# Patient Record
Sex: Female | Born: 1975 | Race: Black or African American | Hispanic: No | Marital: Single | Smoking: Never smoker
Health system: Southern US, Community
[De-identification: ages and names within clinical notes are randomized; demographics above are authoritative.]

## PROBLEM LIST (undated history)

## (undated) DIAGNOSIS — I10 Essential (primary) hypertension: Secondary | ICD-10-CM

## (undated) HISTORY — DX: Essential (primary) hypertension: I10

## (undated) HISTORY — PX: ABDOMINAL HYSTERECTOMY: SHX81

## (undated) HISTORY — PX: BREAST BIOPSY: SHX20

---

## 2000-06-15 ENCOUNTER — Emergency Department (HOSPITAL_COMMUNITY): Admission: EM | Admit: 2000-06-15 | Discharge: 2000-06-15 | Payer: Self-pay | Admitting: Emergency Medicine

## 2001-11-12 ENCOUNTER — Other Ambulatory Visit: Admission: RE | Admit: 2001-11-12 | Discharge: 2001-11-12 | Payer: Self-pay | Admitting: Obstetrics and Gynecology

## 2002-03-31 ENCOUNTER — Other Ambulatory Visit: Admission: RE | Admit: 2002-03-31 | Discharge: 2002-03-31 | Payer: Self-pay | Admitting: Obstetrics and Gynecology

## 2002-07-03 ENCOUNTER — Other Ambulatory Visit: Admission: RE | Admit: 2002-07-03 | Discharge: 2002-07-03 | Payer: Self-pay | Admitting: Obstetrics and Gynecology

## 2003-05-22 ENCOUNTER — Inpatient Hospital Stay (HOSPITAL_COMMUNITY): Admission: AD | Admit: 2003-05-22 | Discharge: 2003-05-22 | Payer: Self-pay | Admitting: *Deleted

## 2003-05-27 ENCOUNTER — Encounter: Admission: RE | Admit: 2003-05-27 | Discharge: 2003-05-27 | Payer: Self-pay | Admitting: Family Medicine

## 2003-06-24 ENCOUNTER — Inpatient Hospital Stay (HOSPITAL_COMMUNITY): Admission: RE | Admit: 2003-06-24 | Discharge: 2003-07-01 | Payer: Self-pay | Admitting: Family Medicine

## 2003-06-24 ENCOUNTER — Encounter: Admission: RE | Admit: 2003-06-24 | Discharge: 2003-06-24 | Payer: Self-pay | Admitting: *Deleted

## 2003-07-01 ENCOUNTER — Encounter: Payer: Self-pay | Admitting: *Deleted

## 2003-07-08 ENCOUNTER — Encounter: Admission: RE | Admit: 2003-07-08 | Discharge: 2003-07-08 | Payer: Self-pay | Admitting: *Deleted

## 2003-07-15 ENCOUNTER — Encounter: Admission: RE | Admit: 2003-07-15 | Discharge: 2003-07-15 | Payer: Self-pay | Admitting: *Deleted

## 2003-07-22 ENCOUNTER — Encounter: Admission: RE | Admit: 2003-07-22 | Discharge: 2003-07-22 | Payer: Self-pay | Admitting: *Deleted

## 2003-07-27 ENCOUNTER — Inpatient Hospital Stay (HOSPITAL_COMMUNITY): Admission: AD | Admit: 2003-07-27 | Discharge: 2003-07-29 | Payer: Self-pay | Admitting: Obstetrics & Gynecology

## 2004-02-10 ENCOUNTER — Encounter: Admission: RE | Admit: 2004-02-10 | Discharge: 2004-02-10 | Payer: Self-pay | Admitting: Family Medicine

## 2004-02-24 ENCOUNTER — Encounter: Admission: RE | Admit: 2004-02-24 | Discharge: 2004-02-24 | Payer: Self-pay | Admitting: Family Medicine

## 2004-02-24 ENCOUNTER — Ambulatory Visit (HOSPITAL_COMMUNITY): Admission: RE | Admit: 2004-02-24 | Discharge: 2004-02-24 | Payer: Self-pay | Admitting: Obstetrics and Gynecology

## 2004-04-11 ENCOUNTER — Encounter: Admission: RE | Admit: 2004-04-11 | Discharge: 2004-04-11 | Payer: Self-pay | Admitting: Obstetrics and Gynecology

## 2004-05-15 ENCOUNTER — Ambulatory Visit (HOSPITAL_COMMUNITY): Admission: RE | Admit: 2004-05-15 | Discharge: 2004-05-15 | Payer: Self-pay | Admitting: Obstetrics and Gynecology

## 2004-06-02 ENCOUNTER — Ambulatory Visit: Payer: Self-pay | Admitting: Family Medicine

## 2005-10-01 ENCOUNTER — Ambulatory Visit: Payer: Self-pay | Admitting: Internal Medicine

## 2005-10-08 ENCOUNTER — Ambulatory Visit: Payer: Self-pay | Admitting: Internal Medicine

## 2005-11-08 ENCOUNTER — Ambulatory Visit: Payer: Self-pay | Admitting: Family Medicine

## 2005-11-12 ENCOUNTER — Ambulatory Visit (HOSPITAL_COMMUNITY): Admission: RE | Admit: 2005-11-12 | Discharge: 2005-11-12 | Payer: Self-pay | Admitting: Obstetrics and Gynecology

## 2005-11-30 ENCOUNTER — Ambulatory Visit: Payer: Self-pay | Admitting: Gynecology

## 2005-11-30 ENCOUNTER — Encounter (INDEPENDENT_AMBULATORY_CARE_PROVIDER_SITE_OTHER): Payer: Self-pay | Admitting: *Deleted

## 2006-01-02 ENCOUNTER — Encounter (INDEPENDENT_AMBULATORY_CARE_PROVIDER_SITE_OTHER): Payer: Self-pay | Admitting: Specialist

## 2006-01-02 ENCOUNTER — Other Ambulatory Visit: Admission: RE | Admit: 2006-01-02 | Discharge: 2006-01-02 | Payer: Self-pay | Admitting: Family Medicine

## 2006-01-02 ENCOUNTER — Ambulatory Visit: Payer: Self-pay | Admitting: Family Medicine

## 2006-01-25 ENCOUNTER — Ambulatory Visit: Payer: Self-pay | Admitting: Gynecology

## 2006-02-01 ENCOUNTER — Ambulatory Visit: Payer: Self-pay | Admitting: Gynecology

## 2006-02-01 ENCOUNTER — Encounter (INDEPENDENT_AMBULATORY_CARE_PROVIDER_SITE_OTHER): Payer: Self-pay | Admitting: Specialist

## 2006-02-01 ENCOUNTER — Inpatient Hospital Stay (HOSPITAL_COMMUNITY): Admission: RE | Admit: 2006-02-01 | Discharge: 2006-02-03 | Payer: Self-pay | Admitting: Gynecology

## 2006-02-28 ENCOUNTER — Ambulatory Visit: Payer: Self-pay | Admitting: Obstetrics and Gynecology

## 2007-06-26 ENCOUNTER — Ambulatory Visit: Payer: Self-pay | Admitting: Obstetrics and Gynecology

## 2007-10-09 ENCOUNTER — Ambulatory Visit: Payer: Self-pay | Admitting: Obstetrics and Gynecology

## 2008-02-25 ENCOUNTER — Emergency Department (HOSPITAL_COMMUNITY): Admission: EM | Admit: 2008-02-25 | Discharge: 2008-02-25 | Payer: Self-pay | Admitting: Emergency Medicine

## 2008-05-20 ENCOUNTER — Ambulatory Visit: Payer: Self-pay | Admitting: Family Medicine

## 2008-05-20 LAB — CONVERTED CEMR LAB
AST: 15 units/L (ref 0–37)
Albumin: 5 g/dL (ref 3.5–5.2)
Alkaline Phosphatase: 44 units/L (ref 39–117)
BUN: 9 mg/dL (ref 6–23)
Basophils Absolute: 0 10*3/uL (ref 0.0–0.1)
Calcium: 9.5 mg/dL (ref 8.4–10.5)
Eosinophils Absolute: 0.2 10*3/uL (ref 0.0–0.7)
Eosinophils Relative: 3 % (ref 0–5)
Glucose, Bld: 83 mg/dL (ref 70–99)
HCT: 38 % (ref 36.0–46.0)
Hemoglobin: 12.4 g/dL (ref 12.0–15.0)
Lymphocytes Relative: 43 % (ref 12–46)
Lymphs Abs: 2.3 10*3/uL (ref 0.7–4.0)
Monocytes Absolute: 0.3 10*3/uL (ref 0.1–1.0)
Neutro Abs: 2.5 10*3/uL (ref 1.7–7.7)
Potassium: 3.7 meq/L (ref 3.5–5.3)
Sodium: 140 meq/L (ref 135–145)
TSH: 0.615 microintl units/mL (ref 0.350–4.50)
WBC: 5.3 10*3/uL (ref 4.0–10.5)

## 2008-05-21 ENCOUNTER — Ambulatory Visit (HOSPITAL_COMMUNITY): Admission: RE | Admit: 2008-05-21 | Discharge: 2008-05-21 | Payer: Self-pay | Admitting: Family Medicine

## 2009-01-10 ENCOUNTER — Ambulatory Visit: Payer: Self-pay | Admitting: Internal Medicine

## 2009-01-11 ENCOUNTER — Encounter (INDEPENDENT_AMBULATORY_CARE_PROVIDER_SITE_OTHER): Payer: Self-pay | Admitting: Internal Medicine

## 2009-07-26 ENCOUNTER — Telehealth (INDEPENDENT_AMBULATORY_CARE_PROVIDER_SITE_OTHER): Payer: Self-pay | Admitting: *Deleted

## 2009-07-26 ENCOUNTER — Ambulatory Visit: Payer: Self-pay | Admitting: Internal Medicine

## 2010-05-08 ENCOUNTER — Emergency Department (HOSPITAL_COMMUNITY): Admission: EM | Admit: 2010-05-08 | Discharge: 2010-05-08 | Payer: Self-pay | Admitting: Emergency Medicine

## 2011-02-06 NOTE — Group Therapy Note (Signed)
NAME:  Yvonne May, Yvonne May NO.:  [id number]   MEDICAL RECORD NO.:  [id number]          PATIENT TYPE:  WOC   LOCATION:  [facility]                   FACILITY:  [facility]   PHYSICIAN:  Argentina Donovan, MD        DATE OF BIRTH:  [birth year]   DATE OF SERVICE:                                  CLINIC NOTE   CHIEF COMPLAINT:  Rectal bleeding.   HISTORY OF PRESENT ILLNESS:  This is a 35 year old female who states  that since she started taking Prozac she has had some rectal bleeding.  The patient says that the bleeding is only noted when she wipes with  toilet tissue.  She states this started about one week after beginning  Prozac and then stopped about one week after stopping the Prozac.  She  said it is definitely not vaginal bleeding.  She has a history of  hemorrhoids in the past.  The patient states that she has had no further  bleeding once it had ceased.  As for the Prozac, her family and friends  say that it is helping.  She was actually on the generic form of that.  However the patient is not taking the Prozac every day.  She is only  taking it on certain days.   PHYSICAL EXAMINATION:  GENERAL:  Not in acute distress.  RECTAL EXAM:  Two external hemorrhoids are noted.  They are not  thrombosed.  They are nontender.  No bleeding present at this time.  Internal exam demonstrates one internal hemorrhoid that is nontender as  well.  No bleeding present.   ASSESSMENT/PLAN:  [age]-year-old female with rectal bleeding.  1. Rectal bleeding:  The rectal bleeding is not secondary to the      Prozac.  It is most likely the result of her hemorrhoids, either      external or internal.  We will attempt to give her mail-in      Hemoccult card specimens to look for blood in her stool.  There      were no stool cards in this clinic.  The patient will have to call      back next week and pick those up.  We will also try a regimen of      stool softeners.  I wrote a prescription for  MiraLax as well as      Colace.  The patient may either use one or both of them depending      on how she feels these medications help her.  If the hemorrhoids      continue to be a problem or cause pain, we could refer her to      surgery.  The patient states that she strains sometimes because      they feel larger, so if this is the case they need to be removed as      well.  Patient may also use over-the-counter Anusol or Preparation      H for symptomatic relief.  2. Premenstrual disorder:  The patient should resume her Prozac daily.   This case was discussed with Dr. Okey Dupre.  Johney Maine, MD    ______________________________  Argentina Donovan, MD   JT/MEDQ  D:  10/09/2007  T:  10/09/2007  Job:  [id number]

## 2011-02-06 NOTE — Group Therapy Note (Signed)
NAME:  Yvonne May, Yvonne May NO.:  [id number]   MEDICAL RECORD NO.:  [id number]          PATIENT TYPE:  WOC   LOCATION:  [facility]                   FACILITY:  [facility]   PHYSICIAN:  Argentina Donovan, MD        DATE OF BIRTH:  Feb 14, 1976   DATE OF SERVICE:  06/26/2007                                  CLINIC NOTE   The patient is a 35 year old African American female who underwent  laparoscopic-assisted vaginal hysterectomy in May 2007, and is in  because of vaginal discharge.   On examination, external genitalia is normal.  BUN is within normal  limits.  Vagina is clean and well rugated, but has a strong amine smell,  is status post hysterectomy.  The adnexa could not be well palpated.  In  addition to this, the patient has been complaining of tremendous mood  swings that seem to be cyclic.  We have talked about the possibility of  PMDD and in talking to her, this is a very strong chance this is what  she has.  In order to determine that, since she has had a hysterectomy,  we are starting her on a temperature chart and we have instructed her,  how to use that for a month and then if the symptoms are starting at  that elevation of her temperature in mid cycle of 98, to start the  Prozac at that point.  She also because of her strong whiff test, are  starting her on Flagyl with a renewal, since she says she gets that  quite frequently and a wet prep was done.   IMPRESSION:  1. Bacterial vaginosis.  2. Premenstrual dysphoric disorder.           ______________________________  Argentina Donovan, MD     PR/MEDQ  D:  06/26/2007  T:  06/27/2007  Job:  [id number]

## 2011-02-09 NOTE — Group Therapy Note (Signed)
NAME:  Yvonne May, MIES NO.:  [id number]   MEDICAL RECORD NO.:  [id number]          PATIENT TYPE:  WOC   LOCATION[facility]                   [facility]ACILITY:  [facility]   PHYSICIAN:  Ginger Carne, MD DATE OF BIRTH:  03/31/1976   DATE OF SERVICE:                                    CLINIC NOTE   REASON FOR CONSULTATION:  Menometrorrhagia.   HISTORY OF PRESENT ILLNESS:  This patient is a 35 year old gravida 5, para 29-  0-1-4 African-American female with a 2-year history of worsening abnormal  uterine bleeding.  Following the birth of her last child, she has had menses  lasting approximately 15 days out of a 30-day cycle.  This varies from heavy  bleeding to spotting.  In the past between having children, she has used  various forms of oral contraceptives with minimal benefit.  The patient  states she still bleeds about 7-10 days out of the month, which she  considers to be heavy.  She has tried Depo-Provera in the past once between  pregnancies and noted significant heavy bleeding lasting more than 3 months.  The patient has no personal history of bleeding diatheses and has no family  history of bleeding disorders.  The patient takes no medications to enhance  bleeding propensity.  She had a pelvic sonogram performed on February 19 of  this year, at which time the uterus appeared to be normal with a normal  stripe and both ovaries appeared normal.  Specifically, no fibroids or  uterine masses were identified.  The patient has had mild anemia with  hemoglobins between 11-11.5 in the past.   OBSTETRICAL/GYNECOLOGICAL HISTORY:  The patient has had 4 full-term vaginal  deliveries.  She has also had a bilateral laparoscopic tubal ligation in  2005.   ALLERGIES:  NONE.   CURRENT MEDICATIONS:  None.   SOCIAL HISTORY:  The patient is a nonsmoker and denies alcohol or drug  abuse.   SURGICAL HISTORY:  Bilateral tubal ligation.   REVIEW OF SYSTEMS:  A 10-point  comprehensive review of systems within normal  limits.   FAMILY HISTORY:  The patient states that siblings, mother and father had no  chronic diseases.  However, on history from her last obstetrical chart, she  states that her mother had her thyroid removed.   PHYSICAL EXAMINATION:  VITAL SIGNS:  Blood pressure 120/78 and weight 156  pounds.  HEENT:  Grossly normal.  BREAST EXAM:  Without masses, discharge, thickenings or tenderness.  CHEST:  Clear to percussion and auscultation.  CARDIOVASCULAR EXAM:  Without murmurs or enlargement and has a regular rate  and rhythm.  EXTREMITY, LYMPHATIC, SKIN, NEUROLOGICAL AND MUSCULOSKELETAL SYSTEMS:  Normal.  ABDOMEN:  Soft without gross hepatosplenomegaly.  PELVIC EXAM:  External genitalia, vulva and vagina normal.  Cervix smooth  without erosion's or lesions.  Pap smear, gonorrhea and Chlamydia culture  obtained.  Uterus is normal size, 4-6 weeks consistent with a multiparous  female.  Both adnexa palpable and found to be normal.   IMPRESSION:  Menometrorrhagia.   PLAN:  The patient was offered the options of a progesterone bearing  intrauterine device, an  alternative form of contraception including a  NuvaRing or patches and these were declined.  The patient has no desire for  further childbearing and has no psychological, medical or surgical  contraindications that would outweigh the benefits of this definitive  surgery.  She also declined the use of Depo-Provera.  At this point with the  patient having undergone a tubal ligation, she certainly is not receptive to  any medical management for her bleeding.  The patient feels that the  bleeding has had a negative impact in her quality-of-life.  She denies  genuine urinary stress incontinence or any other gynecological complaints.   Therefore, a told vaginal hysterectomy with preservation of tubes and  ovaries was offered to the patient.  She also declined the use of a NovaSure  endometrial  ablation, ThermaChoice or any other conservative measures.  She  understood the risks and benefits associated with these procedures including  failure rates.  A hemoglobin electrophoresis, TSH, free T4 and T3, and CBC  were also ordered at this visit.  The nature of a vaginal hysterectomy was  discussed in detail.  The risks including possible injuries to ureter, bowel  and bladder, possible conversion to a laparoscopic or open procedure,  hemorrhage possibly requiring a blood transfusion, infection, pulmonary  complications and other post operative issues were discussed and understood  by the patient.  The patient understands that her ovaries will be left in  place.  All questions are answered to the satisfaction of said patient and  she will be scheduled for said surgery in the near future.           ______________________________  Ginger Carne, MD     SHB/MEDQ  D:  11/30/2005  T:  12/01/2005  Job:  [id number]

## 2011-02-09 NOTE — Group Therapy Note (Signed)
NAME:  Yvonne May, Yvonne May NO.:  [id number]   MEDICAL RECORD NO.:  [id number]          PATIENT TYPE:  WOC   LOCATION:  [facility]                   [facility]ACILITY:  [facility]   PHYSICIAN:  Ginger Carne, MD DATE OF BIRTH:  Aug 03, 1976   DATE OF SERVICE:                                    CLINIC NOTE   ADDENDUM:   AMENDMENT:  After reviewing Ms. Wurtz's insurance information, I was  incorrect in assuming that she had Medicaid.  Apparently, the patient has a  Engineering geologist program.  I explained to the  patient that although her symptoms and complaints will be taken seriously,  based on the lab work that was ordered, it may be appropriate to reconsider  the use of a NovaSure or other endometrial ablation technique as opposed to  a vaginal hysterectomy.  The patient understood that 1 way or another she  will be taken care of, but that the result of her blood work specifically to  evaluate if she is anemic or has a chronic anemic process would help  determine the best course of action.  In a followup conversation with the  patient, she states that out of 15 days of bleeding only about 4 or 5 are  considered significantly heavy and the rest would be considered more  spotting or light flow.           ______________________________  Ginger Carne, MD     SHB/MEDQ  D:  11/30/2005  T:  12/01/2005  Job:  [id number]

## 2011-02-09 NOTE — Discharge Summary (Signed)
NAME:  Yvonne May, FELKER NO.:  [id number]   MEDICAL RECORD NO.:  [id number]           PATIENT TYPE:   LOCATION:                                [facility]   PHYSICIAN:  Phil D. Okey Dupre, M.D.          DATE OF BIRTH:   DATE OF ADMISSION:  02/01/2006  DATE OF DISCHARGE:                                 DISCHARGE SUMMARY   The patient is a 35 year old African-American female who underwent total  vaginal hysterectomy on her day of admission for intractable abnormal  vaginal bleeding.  She has done very well postoperatively.  On the night  prior to discharge she did run a T-max of 100 degrees.  Her lungs are clear.  Abdomen is soft, flat, nontender, no masses, no organomegaly.  The  extremities are negative.  There is no genital bleeding of significance.  The patient has no nausea or vomiting, no diarrhea, and feels quite well and  is anxious for discharge.  Preoperatively she had a hemoglobin of 10.7 and  postoperatively on the day prior to discharge it was 9.9.  We have given her  discharge instructions as to activity, follow-up, and diet and she is being  discharged on Percocet for pain.  Also will be given a prescription for iron  supplement, i.e., Slow Fe and been instructed not to started it until she  has regular bowel movements over the next week.  She is going to be given an  appointment to return to the GYN clinic in two weeks.  Her pathology at the  time of discharge was not available.   DISCHARGE DIAGNOSIS:  Satisfactory recovery following total vaginal  hysterectomy.           ______________________________  Javier Glazier Okey Dupre, M.D.     PDR/MEDQ  D:  02/03/2006  T:  02/04/2006  Job:  [id number]

## 2011-02-09 NOTE — Discharge Summary (Signed)
NAME:  Yvonne May, Yvonne May                     ACCOUNT NO.:  [id number]   MEDICAL RECORD NO.:  [id number]                   PATIENT TYPE:  INP   LOCATION:  9156                                 [facility]   PHYSICIAN:  Lesly Dukes, M.D.              DATE OF BIRTH:  08/09/1976   DATE OF ADMISSION:  07/26/2003  DATE OF DISCHARGE:  07/28/2003                                 DISCHARGE SUMMARY   DISCHARGE DIAGNOSES:  1. Intrauterine pregnancy at 23-2/7 weeks.  2. Status post cerclage at 19 weeks.  3. Preterm labor with dilated cervix.  4. Incompetent cervix.  5. Preterm premature rupture of membranes.   DISCHARGE MEDICATIONS:  1. Unasyn.  2. Terbutaline 0.25 mg subcutaneous q. 30 minutes x3 or until transfer.  3. Status post betamethasone 12.5 mg IM q. 24 x2.   LABORATORY DATA:  Negative UA.   0 AFI on ultrasound. The AFI ultrasound of 0 was on July 28, 2003 and  also note that there was an ultrasound with an AFI of 13 on July 27, 2003.   HISTORY OF PRESENT ILLNESS:  The patient is a 35 year old G3, P1-1-0-2 who  presented at 23-0/7 weeks by last menstrual period, who complained of pink  vaginal spotting and cramping and the feeling that her uterus is balling up.   PAST MEDICAL HISTORY:  The patient has a past medical history positive for  preterm labor and preterm birth with her second child. She also has an  incompetent cervix that was found to be dilated 1 to 2 cm at 19 weeks, at  which time she received a cerclage. In the Maternity Admissions Unit, the  patient was found by digital cervical examination to have a very soft,  approximately 3 cm dilated cervix with loose membranes. Fetal heart rate was  140 to 150, average variability, normal reactivity and without  decelerations. The patient had contractions only as evidenced by uterine  irritability.   HOSPITAL COURSE:  At the time of transfer, the patient was at 23-2/7 weeks  status post cerclage with 5 mm  Mersilene suture with posterior knot placed  July 19, 2003. Admitted on July 26, 2003 for the reasons noted in the  history and physical. The patient was placed on Unasyn, Indocin and placed  on bedrest in Trendelenburg position. Uterine contractions were irritable  and had about 2 over the initial 24 hours. Over the following 24 hours, the  patient felt questionable rupture of membranes. Ultrasound was obtained on  July 28, 2003 a.m., showing 0 AFI (AFI was 13 on July 27, 2003 in the  a.m.), confirming PT ROM and cephalic presentation. The patient received  betamethasone x2 on July 27, 2003 because of plus/minus 2 weeks on dating  [facility] by second week ultrasound. In the evening of July 28, 2003, the  patient began having contractions (felt as back pain) and contractions were  seen on toco q. 3  to 4 minutes. It was felt that labor was impending and  transfer was needed because [facility] N[facility] full. It  was decided to leave the cerclage in place for transfer.  SVE before transfer showed 3 cm dilation, 80% effaced, station negative 2 to  negative 1. The patient was also given Terbutaline 0.25 mg subcutaneous x3  until transfer was completed. The patient was finally transferred to [facility] to the care of Dr. Loraine Maple.     Kerby Nora, MD                           Lesly Dukes, M.D.    AB/MEDQ  D:  10/18/2003  T:  10/19/2003  Job:  [id number]

## 2011-02-09 NOTE — Group Therapy Note (Signed)
NAME:  Yvonne May, Yvonne May NO.:  [id number]   MEDICAL RECORD NO.:  [id number]                   PATIENT TYPE:  WOC   LOCATION:  [facility]                           [facility]ACILITY:  [facility]   PHYSICIAN:  Tinnie Gens, MD                     DATE OF BIRTH:  13-Oct-1975   DATE OF SERVICE:  06/02/2004                                    CLINIC NOTE   CHIEF COMPLAINT:  Follow-up tubal.   SUBJECTIVE:  Yvonne May is now 2 weeks out from a bilateral tubal ligation  which she had on May 15, 2004 by Dr. Okey Dupre.  She states that she thought  her incision opened a little bit after her little girl jumped on her, but  she says it has been healing up well.  No discharge, no bleeding.  No  abdominal pain.  No dysuria.  No bowel changes.  Tolerating p.o. well.  She  is sexually active.   OBJECTIVE:  VITAL SIGNS:  Noted.  GENERAL:  She is a well-appearing female in no acute distress.  ABDOMEN:  Incision was well healed.  Belly was soft.   ASSESSMENT AND PLAN:  Status post tubal ligation.  The patient seems to be  recovering well.  She is to follow up next May for a Pap smear or p.r.n.     Yvonne Gills, MD                       Tinnie Gens, MD    LC/MEDQ  D:  06/02/2004  T:  06/02/2004  Job:  [id number]

## 2011-02-09 NOTE — H&P (Signed)
NAME:  Yvonne May, Yvonne May NO.:  [id number]   MEDICAL RECORD NO.:  [id number]           PATIENT TYPE:   LOCATION:                                 FACILITY:   PHYSICIAN:  Ginger Carne, MD  DATE OF BIRTH:  1976-08-26   DATE OF ADMISSION:  02/01/2006  DATE OF DISCHARGE:                                HISTORY & PHYSICAL   REASON FOR CONSULTATION:  Menometrorrhagia.   HISTORY OF PRESENT ILLNESS:  This is a 35 year old gravida 5, para 53-1-4  African-American female with menses lasting 15 days out of a 30-day cycle  for the past 2 to 3 years.  Bleeding varies from heavy bleeding spotting in  the past between having children.  She has tried various forms of oral  contraceptives with minimal benefit regarding her bleeding.  The patient has  no personal or family history of bleeding diatheses and takes no medications  to enhance her bleeding propensity.  The patient has a normal thyroid  profile. Her hemoglobin is 10.8 and hematocrit 34.3.  Hemoglobin  electrophoresis reveals no evidence of inheritable hemoglobinopathies.  The  patient has had an endometrial biopsy which revealed no evidence of  neoplasia or hyperplasia.  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  Transvaginal ultrasound demonstrates no evidence of intramural fibroids.  Both ovaries appeared to be normal.   OBSTETRICAL/GYNECOLOGICAL HISTORY:  The patient has had four full-term  vaginal deliveries.  She has also had a bilateral laparoscopic tubal  ligation 2005.   ALLERGIES:  None.   CURRENT MEDICATIONS:  None.   SOCIAL HISTORY:  The patient is a nonsmoker and denies alcohol or drug  abuse.   SURGICAL HISTORY:  Bilateral tubal ligation.   REVIEW OF SYSTEMS:  Ten-point comprehensive review of systems within normal  limits.   FAMILY HISTORY:  The patient states that her siblings, mother and father  have no chronic diseases.  In her obstetrical chart from her last delivery,  she did state,  however, that her mother had a partial thyroidectomy.   PHYSICAL EXAMINATION:  VITAL SIGNS:  Blood pressure 120/78, weight 156  pounds.  HEENT: Is grossly normal.  BREASTS:  Breast exam without mass, discharge, thickenings or tenderness.  CHEST:  Clear to percussion and auscultation.  CARDIOVASCULAR EXAM:  Without murmurs or enlargements.  Regular rate and  rhythm.  EXTREMITY, LYMPHATIC, SKIN, NEUROLOGICAL, MUSCULOSKELETAL AND VASCULAR  SYSTEM:  Within normal limits.  ABDOMEN:  Soft without gross hepatosplenomegaly.  PELVIC EXAM:  Negative gonorrhea and Chlamydia cultures. CIN-1 of cervix  External Genitalia, vulva and vagina normal.  Cervix smooth without erosions  or lesions.  The uterus is normal size, 4 to 6 weeks, consistent with a  multiparous female. Both adnexa palpable and found to be normal.   IMPRESSION:  Is menometrorrhagia.   PLAN:  The patient was offered options including progesterone-bearing  intrauterine device, alternative form of contraception including a Nuva ring  or patches. These were declined. She has no desire for further childbearing  and has had a tubal ligation in the past. She has no psychological, medical  or surgical contraindications  that would outweigh the benefits of definitive  surgery.  She declined use of Depo Provera as well as NovaSure and/or  hydroablation technique.  The patient believes that the bleeding has had a  significant negative impact in her quality of life.  She denies genuine  urinary stress incontinence or other gynecological complaints.   The patient will undergo therefore a total vaginal hysterectomy with  preservation of both tubes and ovaries. Ashby Dawes of said procedure discussed  in detail.  Risks including possible injury to ureter, bowel, bladder;  possible conversion to a laparoscopic or open technique; hemorrhage possibly  requiring blood transfusion; infection; pulmonary and/or gastrointestinal  complications and other  unforeseen complications discussed and understood by  said patient.      Ginger Carne, MD  Electronically Signed     SHB/MEDQ  D:  01/30/2006  T:  01/30/2006  Job:  [id number]

## 2011-02-09 NOTE — Discharge Summary (Signed)
NAME:  Yvonne May, Yvonne May                     ACCOUNT NO.:  [id number]   MEDICAL RECORD NO.:  [id number]                   PATIENT TYPE:  INP   LOCATION:  9155                                 FACILITY:  [facility]   PHYSICIAN:  Lesly Dukes, M.D.              DATE OF BIRTH:  1975-12-29   DATE OF ADMISSION:  06/24/2003  DATE OF DISCHARGE:  07/01/2003                                 DISCHARGE SUMMARY   DISCHARGE DIAGNOSES:  1. Cervical incompetence.  2. Intrauterine pregnancy at 19-2/7 weeks.   DISCHARGE MEDICATIONS:  1. Indocin 25 mg p.o. x1 tonight.  2. Prenatal vitamins one p.o. once daily.   DISCHARGE INSTRUCTIONS:  1. The patient was given symptoms of preterm labor and reasons to return to     [facility].  She was also instructed to inform any healthcare     Suhani Stillion who cares for that her cerclage knot is located behind the     cervix.  2. Diet:  No restrictions.  3. Activity:  Bed rest for 1 week and no sex.  4. Followup:  Thursday, July 08, 2003 at 9 a.m. at [facility].   PROCEDURE:  Uterine cervical cerclage, modified Shirodkar by Dr. Okey Dupre on  June 30, 2003.   STUDIES:  1. OB ultrasound on June 24, 2003 at 18-1/7 weeks assigned gestational     age showed a single IUP in breech presentation, a posterior fundal     placenta grade 1, no previa, normal amniotic fluid, biometry consistent     with 18-week 6-day pregnancy, no focal fetal chromosome abnormalities,     female fetus.  Maternal cervix was noted to be 0.7 mm transabdominally     with dynamic opening, at the point of maximal dilatation the internal     cervical os was dilated to 1.2 cm.  2. Transvaginal ultrasound on July 01, 2003 showed dynamic opening of the     cervix to a level above the placement of the cerclage with a residual     measurable cervical length of 1.8 cm.  The endocervical canal dynamically     opened to 0.5 cm.   BRIEF ADMISSION HISTORY:  The patient is a 35 year old  G3, P1-1-0-2 with an  LMP of Feb 15, 2003 with an estimated gestational age of 18-2/7 weeks who  was found to have a dynamic cervix on ultrasound.  The patient denied  cramping, loss of fluid, vaginal bleeding, or urinary complaints.  She does  have history of a 31-week vaginal delivery of her second pregnancy but  carried her first pregnancy to full term.   MEDICATIONS:  Prenatal vitamins.   ALLERGIES:  No known drug allergies.   GYNECOLOGIC HISTORY:  The patient had a history of abnormal Pap smears and  had cryotherapy performed.  No medical problems.   SOCIAL HISTORY:  Denies alcohol, tobacco, or other drug use.   REVIEW OF SYSTEMS:  The  patient denied any awareness of uterine tightening  or contractions so far this pregnancy.  Fetal heart rate with Dopplers in  the 150s and the tocometer showed uterine irritability.  The patient was  admitted for incompetent cervix versus preterm labor, placed in  Trendelenburg, started on Unasyn and Indocin.   HOSPITAL COURSE:  On hospital day #1 the patient was noted by Dr. Gavin Potters to  have definite development of the lower uterine segment.  Wet prep, GC and  Chlamydia were negative.  GBS is pending.  Throughout her hospitalization  the patient denied any cramping, contractions, bleeding, or loss of fluid.  However, she did feel occasional episodes of balling up .  In addition,  tocometry showed continued uterine irritability and increased uterine  contractions.  Reassessment on June 28, 2003 by Dr. Gavin Potters showed much  improvement in her vaginal exam with no development of a uterine segment and  dilation to fingertip external os, closed internal os.  Decision was made to  place a cerclage.  Risks and benefits were explained to the patient.  The  patient tolerated the procedure well with no complication.   On the day of discharge the patient was doing well, was complaining of only  a small amount of pink vaginal discharge, normal following  cerclage  placement.  Again not feeling uterine contractions, pain, vaginal bleeding  or fluid and reporting good fetal movement.  Fetal heart tones Dopplered in  the 150s and she was noted to have no uterine contractions but occasional  uterine irritability.  An ultrasound to assess cervical length was done  prior to admission and is documented above.  The patient was continued on  Indocin q.6h. for six doses while the Unasyn and terbutaline that she had  been placed on during hospitalization were discontinued.  She is to remain  on bed rest for 1 week and followup the high risk clinic appointment as  scheduled.   PENDING LABORATORIES:  Need to follow up on triple screen and also GBS  status.     Georgina Peer, M.D.                 Lesly Dukes, M.D.    JM/MEDQ  D:  07/08/2003  T:  07/08/2003  Job:  [id number]   cc:   [facility]

## 2011-02-09 NOTE — Op Note (Signed)
NAME:  Yvonne May, START                     ACCOUNT NO.:  [id number]   MEDICAL RECORD NO.:  [id number]                   PATIENT TYPE:  AMB   LOCATION:  SDC                                  [facility]ILITY:  [facility]   PHYSICIAN:  Phil D. Okey Dupre, M.D.                  DATE OF BIRTH:  1976/07/18   DATE OF PROCEDURE:  05/15/2004  DATE OF DISCHARGE:                                 OPERATIVE REPORT   PREOPERATIVE DIAGNOSIS:  Voluntary sterilization.   POSTOPERATIVE DIAGNOSIS:  Voluntary sterilization.   PROCEDURE:  Laparoscopic sterilization.   PROCEDURE:  Under satisfactory general anesthesia with the patient in dorsal  semi-lithotomy position, the perineum and vagina and abdomen were prepped  and draped in the usual sterile manner after a tenaculum was attached to the  cervix and an acorn cannula placed in the cervix attached to the tenaculum  for mobilization of the uterus.  A Veress needle was then inserted into the  peritoneal cavity at the lower pole of the umbilicus and approximately 3 L  of carbon dioxide slowly insufflated into the peritoneal cavity.  Equal  tympany occurred over the entire abdominal wall.  The Veress needle was  removed.  A 1 cm transverse incision made just below the umbilicus and the  laparoscopic trocar inserted into the peritoneal cavity, trocar removed from  the sleeve, the laparoscope inserted, pelvic organs easily visualized, and  everything was within normal limits.  Each fallopian tube was then grasped  with a Kleppinger grasper in the midportion and with the bipolar cautery  coagulated until blanching occurred from both sides of the clamp.  Both  tubes were then bisected in the area of the coagulated tube and the area was  observed for any bleeding or other injury to bowel or viscus.  None was seen  and the laparoscope was removed, as much CO2 as possible expressed through  the sleeve removed.  The incision was closed with a 3-0 Vicryl running  suture that  first closed the fascia and then was continued up for the  subcutaneous tissue at that point, and that was tied and continued and the  suture was used to continue up to a subcuticular closure.  A dry sterile  dressing was applied.  The patient tolerated the procedure well with a  minimal blood loss and was transferred to the recovery room in satisfactory  condition after the tenaculum and cannula were removed from the vagina and  the tape, instrument, sponge, and needle count reported correct at the end  of the procedure.                                               Phil D. Okey Dupre, M.D.    PDR/MEDQ  D:  05/15/2004  T:  05/15/2004  Job:  [id number] 

## 2011-02-09 NOTE — Op Note (Signed)
NAME:  Yvonne May, Yvonne May                     ACCOUNT NO.:  [id number]   MEDICAL RECORD NO.:  [id number]                   PATIENT TYPE:  INP   LOCATION:  9155                                 [facility]   PHYSICIAN:  Phil D. Okey Dupre, M.D.                  DATE OF BIRTH:  12/30/1975   DATE OF PROCEDURE:  06/30/2003  DATE OF DISCHARGE:                                 OPERATIVE REPORT   PREOPERATIVE DIAGNOSIS:  Cervical incompetence.   POSTOPERATIVE DIAGNOSIS:  Cervical incompetence.   PROCEDURE:  Uterine cervical cerclage, modified Shirodkar.   SURGEON:  Javier Glazier. Okey Dupre, M.D.   ANESTHESIA:  Spinal.   ESTIMATED BLOOD LOSS:  Less than 10 mL.   REASON FOR PROCEDURE:  The patient at 19 weeks and one day was admitted with  a dilated cervix and put in Trendelenburg position.  Cervix is improved over  the last few days since her admission, when it was noted that she had a  dynamic cervix on ultrasound.  The patient had a normal full-term baby the  first time.  The second baby was preterm following some cervical surgery.  This patient went for a routine anatomic ultrasound and it was noted at that  time that the cervix was dynamic and about a centimeter dilated, and Dr.  Gavin Potters stated that the lower uterine segment was developing.  In bed rest  this resolved and we counseled the patient as to pros and cons of cerclage,  which she acquiesced to.   The procedure went as follows:  Under satisfactory spinal anesthesia, the  patient in the dorsal lithotomy position, the perineum and vagina were  prepped and draped in the usual sterile manner.  Bimanual pelvic examination  revealed a cervix that easily admitted a fingertip up to the internal  cervical os, which was closed, but it was patulous and widely open at the  external os and did not seem to be significantly shortened.  A weighted  speculum was placed on the posterior fourchette of the vagina.  BUS was  within normal limits.  The  vagina was clean and well-rugated.  The cervix  was parous, clean.  For a slender patient there was much vaginal redundant  tissue, which I expect that this uterus is also starting to prolapse and  will be in that position postpartum.  The anterior and posterior lips of the  cervix were grasped with ring forceps and the cervix just below the bladder  flap was injected with 1% Xylocaine with 1:100,000 epinephrine and an  incision made with a small blade scalpel transversely just below the bladder  flap.  The bladder was pushed up away from the distal end of the cervix by  blunt dissection and using a 5 mm Mersilene suture, the suture was placed  into the cervix at that point and brought out in the left and right sides of  the cervix at  9 and 3 o'clock, respectively, reinserted at 3 and 9 o'clock,  and brought out at 12 o'clock.  All these sutures were approximately 3 cm  from the distal end of the cervix.  The suture was then tied snugly  posterior to the cervix after the needles had been cut off.  Using a 3-0  chromic suture, the bladder was then re-epithelialized over the anterior  suture and a small figure-of-eight had to be placed over the posterior  suture because of a small pumping bleeder.  This seemed to control the  bleeding quite well.  The patient tolerated the procedure well and was  transferred to the recovery room in satisfactory condition with a minimal  blood loss.                                               Phil D. Okey Dupre, M.D.    PDR/MEDQ  D:  06/30/2003  T:  06/30/2003  Job:  [id number]

## 2011-02-09 NOTE — Op Note (Signed)
NAME:  Yvonne May, Yvonne May NO.:  [id number]   MEDICAL RECORD NO.:  [id number]           PATIENT TYPE:   LOCATION:                                [facility]   PHYSICIAN:  Ginger Carne, MD       DATE OF BIRTH:   DATE OF PROCEDURE:  DATE OF DISCHARGE:                                 OPERATIVE REPORT   PREOPERATIVE DIAGNOSIS:  Menometrorrhagia.   POSTOPERATIVE DIAGNOSIS:  Menometrorrhagia.   PROCEDURE:  Total vaginal hysterectomy with preservation of both tubes and  ovaries.   SURGEON:  Ginger Carne, M.D.   ASSISTANT:  None.   COMPLICATIONS:  None immediate.   ESTIMATED BLOOD LOSS:  Less than 50 mL.   SPECIMEN:  Uterus and cervix.   ANESTHESIA:  General with paracervical block.   OPERATIVE FINDINGS:  Uterus was 6 weeks in size, globular.  Both tubes and  ovaries appeared normal.  External genitalia, vulva and vagina normal.   OPERATIVE PROCEDURE:  The patient was prepped and draped in the usual  fashion and placed in the lithotomy position.  Betadine solution was used  for antiseptic.  The patient was catheterized prior to procedure.  After  adequate general anesthesia, double tooth tenaculum was placed on the  anterior and posterior leaves of cervix.  Marcaine with epinephrine was  utilized as a paracervical block.  There were 2 cm of anterior posterior  vaginal epithelium incised transversely.  Peritoneal reflections were  similarly opened without injury to the respective organs.  Following this,  the uterosacral cardinal ligament complexes were clamped and ligated with#0  Vicryl suture and affixed to their respective vaginal sidewalls.  Uterine  vasculature in the standard Richardson fashion were clamped and ligated with  #0 Vicryl suture including the ascending branches and portions of the broad  ligament.  At this point utero-ovarian ligaments were transfixed with #0  Vicryl suture and tied.  Bleeding points were hemostatically checked.   No  active bleeding noted after 2 minutes of inspection.  Afterwards, copious  irrigation followed.  Closure of the cuff in one  layer with #0 Vicryl running interlocking suture.  The patient tolerated the  procedure well and returned to post anesthesia recovery room in excellent  condition.   Urine clear at the end of the procedure.      Ginger Carne, MD  Electronically Signed     SHB/MEDQ  D:  02/01/2006  T:  02/02/2006  Job:  [id number]

## 2011-02-09 NOTE — Group Therapy Note (Signed)
NAME:  Yvonne May, DEVINS NO.:  [id number]   MEDICAL RECORD NO.:  [id number]          PATIENT TYPE:  WOC   LOCATION:  [facility]                   FACILITY:  [facility]   PHYSICIAN:  Ginger Carne, MD DATE OF BIRTH:  26-Jan-1976   DATE OF SERVICE:                                    CLINIC NOTE   REASON FOR CONSULTATION:  Follow up on menometrorrhagia.   HISTORY OF PRESENT ILLNESS:  This patient is a 35 year old gravida 5, para  4, 1-4 African-American female with a longstanding history of abnormal  uterine bleeding for over two years.  Please refer to the clinic note on  November 30, 2005 for detailed history.  She had an endometrial biopsy at that  time.  At the time, the patient did not demonstrate evidence for any  abnormalities, including fibroids or other specific lesions.  Ultrasound was  obtained of the pelvis on November 12, 2005, which showed normal uterus,  endometrial stripe, and both ovaries.  She has no risk factors for carcinoma  of the uterus.  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   In discussing the pro's and con's of the NovaSure endometrial ablation  technique, including risks for further bleeding and amenorrhea, as opposed  to a total vaginal hysterectomy, the patient opted for the latter.  The  patient has been on multiple courses of oral contraceptives, Depo-Provera,  norethindrone acetate without benefit.  The patient understood that with the  NovaSure technique, there is a 60% amenorrhea rate but a 40% risk of  persistent bleeding of various degrees.  The nature of said procedure  discussed in detail for hysterectomy, including risk of injury to ureter,  bowel and bladder, possible conversion to an open or laparoscopic procedure,  hemorrhage possibly requiring blood transfusion, infection, pulmonary  complications, intestinal complications, or any other  unforeseen factors.  The patient was satisfied with all information provided to her and had the  opportunity to ask questions.  An endometrial biopsy was not performed, as  the patient has no risk factors for endometrial carcinoma, is young, and has  a normal ultrasound.           ______________________________  Ginger Carne, MD     SHB/MEDQ  D:  01/25/2006  T:  01/25/2006  Job:  [id number]

## 2017-10-03 ENCOUNTER — Other Ambulatory Visit: Payer: Self-pay | Admitting: Internal Medicine

## 2017-10-03 DIAGNOSIS — Z1231 Encounter for screening mammogram for malignant neoplasm of breast: Secondary | ICD-10-CM

## 2017-12-04 ENCOUNTER — Ambulatory Visit
Admission: RE | Admit: 2017-12-04 | Discharge: 2017-12-04 | Disposition: A | Discharge status: Home / Self Care | Payer: Managed Care, Other (non HMO) | Source: Ambulatory Visit | Attending: Internal Medicine | Admitting: Internal Medicine

## 2017-12-04 DIAGNOSIS — Z1231 Encounter for screening mammogram for malignant neoplasm of breast: Secondary | ICD-10-CM

## 2017-12-06 ENCOUNTER — Other Ambulatory Visit: Payer: Self-pay | Admitting: Internal Medicine

## 2017-12-06 DIAGNOSIS — R928 Other abnormal and inconclusive findings on diagnostic imaging of breast: Secondary | ICD-10-CM

## 2018-01-31 ENCOUNTER — Ambulatory Visit
Admission: RE | Admit: 2018-01-31 | Discharge: 2018-01-31 | Disposition: A | Discharge status: Home / Self Care | Payer: Managed Care, Other (non HMO) | Source: Ambulatory Visit | Attending: Internal Medicine | Admitting: Internal Medicine

## 2018-01-31 ENCOUNTER — Other Ambulatory Visit: Payer: Self-pay | Admitting: Internal Medicine

## 2018-01-31 DIAGNOSIS — R928 Other abnormal and inconclusive findings on diagnostic imaging of breast: Secondary | ICD-10-CM

## 2018-01-31 DIAGNOSIS — N6320 Unspecified lump in the left breast, unspecified quadrant: Secondary | ICD-10-CM

## 2018-02-03 ENCOUNTER — Ambulatory Visit
Admission: RE | Admit: 2018-02-03 | Discharge: 2018-02-03 | Disposition: A | Discharge status: Home / Self Care | Payer: Managed Care, Other (non HMO) | Source: Ambulatory Visit | Attending: Internal Medicine | Admitting: Internal Medicine

## 2018-02-03 ENCOUNTER — Other Ambulatory Visit: Payer: Self-pay | Admitting: Internal Medicine

## 2018-02-03 DIAGNOSIS — N6320 Unspecified lump in the left breast, unspecified quadrant: Secondary | ICD-10-CM

## 2018-10-27 ENCOUNTER — Encounter: Payer: Self-pay | Admitting: Internal Medicine

## 2018-11-21 ENCOUNTER — Ambulatory Visit (INDEPENDENT_AMBULATORY_CARE_PROVIDER_SITE_OTHER): Payer: Managed Care, Other (non HMO) | Admitting: Nurse Practitioner

## 2018-11-21 ENCOUNTER — Encounter: Payer: Self-pay | Admitting: Nurse Practitioner

## 2018-11-21 VITALS — BP 138/84 | HR 67 | Temp 97.9°F | Ht 66.2 in | Wt 183.4 lb

## 2018-11-21 DIAGNOSIS — Z0000 Encounter for general adult medical examination without abnormal findings: Secondary | ICD-10-CM

## 2018-11-21 DIAGNOSIS — Z113 Encounter for screening for infections with a predominantly sexual mode of transmission: Secondary | ICD-10-CM | POA: Diagnosis not present

## 2018-11-21 DIAGNOSIS — I10 Essential (primary) hypertension: Secondary | ICD-10-CM | POA: Insufficient documentation

## 2018-11-21 DIAGNOSIS — Z124 Encounter for screening for malignant neoplasm of cervix: Secondary | ICD-10-CM

## 2018-11-21 DIAGNOSIS — E559 Vitamin D deficiency, unspecified: Secondary | ICD-10-CM

## 2018-11-21 LAB — POCT URINALYSIS DIP (PROADVANTAGE DEVICE)
Blood, UA: NEGATIVE
Glucose, UA: NEGATIVE mg/dL
LEUKOCYTES UA: NEGATIVE
NITRITE UA: NEGATIVE
PROTEIN UA: NEGATIVE mg/dL
UUROB: 0.2
pH, UA: 5.5 (ref 5.0–8.0)

## 2018-11-21 NOTE — Progress Notes (Addendum)
Subjective:     Patient ID: Yvonne May , female    DOB: Sep 13, 1976 , 43 y.o.   MRN: [id number]   Chief Complaint  Patient presents with  . Annual Exam    fasting 7 hours / pap    The patient states she uses status post hysterectomy for birth control. Last LMP was No LMP recorded. Patient is perimenopausal.. Negative for Dysmenorrhea and Negative for Menorrhagia Mammogram last done 12/04/2017, due again in March 2020.   Negative for: breast discharge, breast lump(s), breast pain and breast self exam.  Pertinent negatives include abnormal bleeding (hematology), anxiety, decreased libido, depression, difficulty falling sleep, dyspareunia, history of infertility, nocturia, sexual dysfunction, sleep disturbances, urinary incontinence, urinary urgency, vaginal discharge and vaginal itching. Diet regular.  Does not cook as much due to the long hours.  Tries to eat more fruits and vegetables.  The patient states her exercise level is  rarely.  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     The patient's tobacco use is:  Social History   Tobacco Use  Smoking Status Never Smoker  Smokeless Tobacco Never Used   She has been exposed to passive smoke. The patient's alcohol use is:  Social History   Substance and Sexual Activity  Alcohol Use Never  . Frequency: Never   Additional information: Last pap 2016, next one scheduled for 3-5 years.    HPI  HPI   No past medical history on file.   No family history on file.   Current Outpatient Medications:  .  Ascorbic Acid (VITAMIN C) 1000 MG tablet, Take 1,000 mg by mouth daily., Disp: , Rfl:  .  cholecalciferol (VITAMIN D3) 25 MCG (1000 UT) tablet, Take 1,000 Units by mouth daily., Disp: , Rfl:  .  naproxen sodium (ALEVE) 220 MG tablet, Take 220 mg by mouth daily as needed., Disp: , Rfl:    Allergies  Allergen Reactions  . Penicillins     Since childhood     Review of Systems  Constitutional: Negative.    HENT: Negative.   Eyes: Negative.   Respiratory: Negative.   Cardiovascular: Negative.   Gastrointestinal: Negative.   Endocrine: Negative.   Genitourinary: Negative.   Musculoskeletal: Negative.   Skin: Negative.   Allergic/Immunologic: Negative.   Neurological: Negative.   Hematological: Negative.   Psychiatric/Behavioral: Negative.      Today's Vitals   11/21/18 1427  BP: (!) 142/88  Pulse: 67  Temp: 97.9 F (36.6 C)  SpO2: 99%  Weight: 183 lb 6.4 oz (83.2 kg)  Height: 5' 6.2" (1.681 m)   Body mass index is 29.42 kg/m.   Objective:  Physical Exam Vitals signs reviewed.  Constitutional:      Appearance: Normal appearance. She is well-developed.  HENT:     Head: Normocephalic and atraumatic.     Right Ear: Hearing, tympanic membrane, ear canal and external ear normal.     Left Ear: Hearing, tympanic membrane, ear canal and external ear normal.     Nose: Nose normal.     Mouth/Throat:     Mouth: Mucous membranes are moist.  Eyes:     General: Lids are normal.     Conjunctiva/sclera: Conjunctivae normal.     Pupils: Pupils are equal, round, and reactive to light.     Funduscopic exam:    Right eye: No papilledema.        Left eye: No papilledema.  Neck:     Musculoskeletal: Full  passive range of motion without pain, normal range of motion and neck supple.     Thyroid: No thyroid mass.     Vascular: No carotid bruit.  Cardiovascular:     Rate and Rhythm: Normal rate and regular rhythm.     Pulses: Normal pulses.     Heart sounds: Normal heart sounds. No murmur.  Pulmonary:     Effort: Pulmonary effort is normal.     Breath sounds: Normal breath sounds.  Chest:     Chest wall: No mass.     Breasts: Breasts are symmetrical.        Right: Normal.        Left: Normal.  Abdominal:     General: Abdomen is flat. Bowel sounds are normal.     Palpations: Abdomen is soft.  Genitourinary:    Pubic Area: No rash.      Vagina: Normal.     Cervix: Normal.      Uterus: Normal.      Adnexa: Right adnexa normal and left adnexa normal.     Rectum: Normal.  Musculoskeletal: Normal range of motion.        General: No swelling.     Right lower leg: No edema.     Left lower leg: No edema.  Skin:    General: Skin is warm and dry.     Capillary Refill: Capillary refill takes less than 2 seconds.  Neurological:     General: No focal deficit present.     Mental Status: She is alert and oriented to person, place, and time.     Cranial Nerves: No cranial nerve deficit.     Sensory: No sensory deficit.  Psychiatric:        Mood and Affect: Mood normal.        Behavior: Behavior normal.        Thought Content: Thought content normal.        Judgment: Judgment normal.         Assessment And Plan:     1. Routine adult health maintenance . Behavior modifications discussed and diet history reviewed.   . Pt will continue to exercise regularly and modify diet with low GI, plant based foods and decrease intake of processed foods.  . Recommend intake of daily multivitamin, Vitamin D, and calcium.  . Recommend mammogram for preventive screenings, as well as recommend immunizations that include influenza, TDAP  - POCT Urinalysis DIP (Proadvantage Device) - Lipid panel - CMP14 + Anion Gap - Hemoglobin A1c - Cytology -Pap Smear - NuSwab Vaginitis Plus (VG+)  2. Elevated blood pressure reading in office with diagnosis of hypertension . B/P is slightly elevated, will have her to return in follow up . She is advised to avoid high salt foods and to increase her water intake, . Avoid processed foods . CMP ordered to check renal function.  . The importance of regular exercise and dietary modification was stressed to the patient.  . Stressed importance of losing ten percent of her body weight to help with B/P control.  . The weight loss would help with decreasing cardiac and cancer risk as well.   3. Encounter for Papanicolaou smear of cervix  PAP done no  abnormal physical findings  4. Screen for STD (sexually transmitted disease)       [name], FNP

## 2018-11-22 LAB — LIPID PANEL
CHOL/HDL RATIO: 2.7 ratio (ref 0.0–4.4)
Cholesterol, Total: 169 mg/dL (ref 100–199)
HDL: 62 mg/dL (ref >39)
LDL CALC: 96 mg/dL (ref 0–99)
Triglycerides: 54 mg/dL (ref 0–149)
VLDL CHOLESTEROL CAL: 11 mg/dL (ref 5–40)

## 2018-11-22 LAB — CMP14 + ANION GAP
ALK PHOS: 46 IU/L (ref 39–117)
ALT: 10 IU/L (ref 0–32)
AST: 16 IU/L (ref 0–40)
Albumin/Globulin Ratio: 1.5 (ref 1.2–2.2)
Albumin: 4.3 g/dL (ref 3.8–4.8)
Anion Gap: 16 mmol/L (ref 10.0–18.0)
BILIRUBIN TOTAL: 0.4 mg/dL (ref 0.0–1.2)
BUN/Creatinine Ratio: 12 (ref 9–23)
BUN: 11 mg/dL (ref 6–24)
CHLORIDE: 102 mmol/L (ref 96–106)
CO2: 20 mmol/L (ref 20–29)
Calcium: 9.1 mg/dL (ref 8.7–10.2)
Creatinine, Ser: 0.95 mg/dL (ref 0.57–1.00)
GFR calc non Af Amer: 74 mL/min/{1.73_m2} (ref >59)
GFR, EST AFRICAN AMERICAN: 85 mL/min/{1.73_m2} (ref >59)
GLUCOSE: 75 mg/dL (ref 65–99)
Globulin, Total: 2.9 g/dL (ref 1.5–4.5)
POTASSIUM: 4.1 mmol/L (ref 3.5–5.2)
Sodium: 138 mmol/L (ref 134–144)
Total Protein: 7.2 g/dL (ref 6.0–8.5)

## 2018-11-22 LAB — HEMOGLOBIN A1C
Est. average glucose Bld gHb Est-mCnc: 108 mg/dL
HEMOGLOBIN A1C: 5.4 % (ref 4.8–5.6)

## 2018-11-24 ENCOUNTER — Telehealth: Payer: Self-pay

## 2018-11-24 NOTE — Telephone Encounter (Signed)
-----   Message from [name], FNP sent at 11/24/2018  8:48 AM EST ----- Cholesterol levels are normal.  Kidney and liver functions are normal.  HgbA1c is normal at 5.4.  Make sure you stay well hydrated with water.

## 2018-11-24 NOTE — Telephone Encounter (Signed)
1st attempt to give lab results  

## 2018-11-25 ENCOUNTER — Other Ambulatory Visit (HOSPITAL_COMMUNITY)
Admission: RE | Admit: 2018-11-25 | Discharge: 2018-11-25 | Disposition: A | Discharge status: Home / Self Care | Payer: Managed Care, Other (non HMO) | Source: Ambulatory Visit | Attending: Internal Medicine | Admitting: Internal Medicine

## 2018-11-25 DIAGNOSIS — Z0000 Encounter for general adult medical examination without abnormal findings: Secondary | ICD-10-CM | POA: Diagnosis not present

## 2018-11-27 ENCOUNTER — Other Ambulatory Visit (HOSPITAL_COMMUNITY)
Admission: RE | Admit: 2018-11-27 | Discharge: 2018-11-27 | Disposition: A | Discharge status: Home / Self Care | Payer: Managed Care, Other (non HMO) | Source: Ambulatory Visit | Attending: Nurse Practitioner | Admitting: Nurse Practitioner

## 2018-11-27 DIAGNOSIS — Z0000 Encounter for general adult medical examination without abnormal findings: Secondary | ICD-10-CM | POA: Diagnosis not present

## 2018-11-27 DIAGNOSIS — B9689 Other specified bacterial agents as the cause of diseases classified elsewhere: Secondary | ICD-10-CM | POA: Diagnosis not present

## 2018-11-28 ENCOUNTER — Other Ambulatory Visit: Payer: Self-pay | Admitting: Nurse Practitioner

## 2018-11-28 DIAGNOSIS — N760 Acute vaginitis: Principal | ICD-10-CM

## 2018-11-28 DIAGNOSIS — B9689 Other specified bacterial agents as the cause of diseases classified elsewhere: Secondary | ICD-10-CM

## 2018-11-28 DIAGNOSIS — B379 Candidiasis, unspecified: Secondary | ICD-10-CM

## 2018-11-28 LAB — CERVICOVAGINAL ANCILLARY ONLY
Bacterial vaginitis: POSITIVE — AB
CANDIDA VAGINITIS: POSITIVE — AB
CHLAMYDIA, DNA PROBE: NEGATIVE
NEISSERIA GONORRHEA: NEGATIVE
Trichomonas: NEGATIVE

## 2018-11-28 MED ORDER — FLUCONAZOLE 150 MG PO TABS: ORAL_TABLET | ORAL | 0 refills | Status: DC

## 2018-11-28 MED ORDER — METRONIDAZOLE 500 MG PO TABS: 500.0000 mg | ORAL_TABLET | Freq: Two times a day (BID) | ORAL | 0 refills | Status: DC

## 2018-12-01 LAB — CYTOLOGY - PAP: DIAGNOSIS: NEGATIVE

## 2019-01-28 ENCOUNTER — Ambulatory Visit (HOSPITAL_COMMUNITY)
Admission: EM | Admit: 2019-01-28 | Discharge: 2019-01-28 | Disposition: A | Discharge status: Home / Self Care | Payer: Managed Care, Other (non HMO) | Attending: Family Medicine | Admitting: Family Medicine

## 2019-01-28 ENCOUNTER — Encounter (HOSPITAL_COMMUNITY): Payer: Self-pay | Admitting: Emergency Medicine

## 2019-01-28 ENCOUNTER — Other Ambulatory Visit: Payer: Self-pay

## 2019-01-28 DIAGNOSIS — L810 Postinflammatory hyperpigmentation: Secondary | ICD-10-CM

## 2019-01-28 MED ORDER — HYDROQUINONE 4 % EX CREA: TOPICAL_CREAM | Freq: Two times a day (BID) | CUTANEOUS | 1 refills | Status: DC

## 2019-01-28 NOTE — ED Notes (Signed)
Patient able to ambulate independently  

## 2019-01-28 NOTE — ED Triage Notes (Signed)
Pt presents to [facility] for assessment of rash to right arm x 1 week, states she was working in the yard when it occurred.  States it is not itchy, not painful, but she can't get the discoloration on her right forearm to go away.

## 2019-01-31 NOTE — ED Provider Notes (Signed)
[facility]   [id number] 01/28/19 Arrival Time: 1619  ASSESSMENT & PLAN:  1. Post-inflammatory hyperpigmentation    Trial of: Meds ordered this encounter  Medications  . hydroquinone 4 % cream    Sig: Apply topically 2 (two) times daily.    Dispense:  28.35 g    Refill:  1   Recommend dermatology f/u if not improving over the next 3-4 weeks.  Reviewed expectations re: course of current medical issues. Questions answered. Outlined signs and symptoms indicating need for more acute intervention. Patient verbalized understanding. After Visit Summary given.   SUBJECTIVE:  Yvonne May is a 43 y.o. female who presents with a skin complaint. Reports noticing an itchy rash on her R forearm last week after yard work. Questions insect bites. Few small raised areas initially that have resolved. Now she reports darkening of skin over this area. No current itching or associated pain. Afebrile. Used OTC itch cream for a few days; none over the past 3-4 days. No h/o skin problems reported. No open wounds. No specific aggravating or alleviating factors reported.  ROS: As per HPI.  OBJECTIVE: Vitals:   01/28/19 1638  BP: (!) 157/97  Pulse: (!) 115  Resp: 18  Temp: 98.3 F (36.8 C)  TempSrc: Oral  SpO2: 100%    General appearance: alert; no distress Lungs: clear to auscultation bilaterally Heart: regular rate and rhythm Extremities: no edema Skin: warm and dry; R forearm with an approx 3x4 inch area of darkened skin; flat; no open wounds or lesions; no excoriations; normal skin temperature compared to surrounding area Psychological: alert and cooperative; normal mood and affect  Allergies  Allergen Reactions  . Penicillins     Since childhood     Social History   Socioeconomic History  . Marital status: Single    Spouse name: Not on file  . Number of children: Not on file  . Years of education: Not on file  . Highest education level: Not on file   Occupational History  . Not on file  Social Needs  . Financial resource strain: Not on file  . Food insecurity:    Worry: Not on file    Inability: Not on file  . Transportation needs:    Medical: Not on file    Non-medical: Not on file  Tobacco Use  . Smoking status: Never Smoker  . Smokeless tobacco: Never Used  Substance and Sexual Activity  . Alcohol use: Never    Frequency: Never  . Drug use: Never  . Sexual activity: Yes    Partners: Male    Birth control/protection: None  Lifestyle  . Physical activity:    Days per week: Not on file    Minutes per session: Not on file  . Stress: Not on file  Relationships  . Social connections:    Talks on phone: Not on file    Gets together: Not on file    Attends religious service: Not on file    Active member of club or organization: Not on file    Attends meetings of clubs or organizations: Not on file    Relationship status: Not on file  . Intimate partner violence:    Fear of current or ex partner: Not on file    Emotionally abused: Not on file    Physically abused: Not on file    Forced sexual activity: Not on file  Other Topics Concern  . Not on file  Social History Narrative  .  Not on file   FH: HTN  History reviewed. No pertinent surgical history.   Mardella Layman, MD 01/31/19 [id number]

## 2019-02-17 ENCOUNTER — Telehealth: Payer: Self-pay

## 2019-02-17 ENCOUNTER — Ambulatory Visit: Payer: Managed Care, Other (non HMO) | Admitting: Nurse Practitioner

## 2019-02-17 ENCOUNTER — Other Ambulatory Visit: Payer: Self-pay

## 2019-02-17 NOTE — Telephone Encounter (Signed)
The pt left a message that she wanted to cancel her appt because it was changed to an in house appt. Marquette Old kept trying to call the pt to get her ready for a virtual visit and the pt wouldn't answer the phone.

## 2019-02-20 ENCOUNTER — Ambulatory Visit: Payer: Managed Care, Other (non HMO) | Admitting: Nurse Practitioner

## 2019-03-02 ENCOUNTER — Other Ambulatory Visit: Payer: Self-pay | Admitting: Internal Medicine

## 2019-03-02 DIAGNOSIS — Z1231 Encounter for screening mammogram for malignant neoplasm of breast: Secondary | ICD-10-CM

## 2019-04-16 ENCOUNTER — Ambulatory Visit
Admission: RE | Admit: 2019-04-16 | Discharge: 2019-04-16 | Disposition: A | Discharge status: Home / Self Care | Payer: Managed Care, Other (non HMO) | Source: Ambulatory Visit | Attending: Internal Medicine | Admitting: Internal Medicine

## 2019-04-16 ENCOUNTER — Other Ambulatory Visit: Payer: Self-pay

## 2019-04-16 DIAGNOSIS — Z1231 Encounter for screening mammogram for malignant neoplasm of breast: Secondary | ICD-10-CM

## 2019-04-20 ENCOUNTER — Other Ambulatory Visit: Payer: Self-pay | Admitting: Internal Medicine

## 2019-04-20 DIAGNOSIS — R928 Other abnormal and inconclusive findings on diagnostic imaging of breast: Secondary | ICD-10-CM

## 2019-04-27 ENCOUNTER — Ambulatory Visit
Admission: RE | Admit: 2019-04-27 | Discharge: 2019-04-27 | Disposition: A | Discharge status: Home / Self Care | Payer: Managed Care, Other (non HMO) | Source: Ambulatory Visit | Attending: Internal Medicine | Admitting: Internal Medicine

## 2019-04-27 ENCOUNTER — Other Ambulatory Visit: Payer: Self-pay

## 2019-04-27 ENCOUNTER — Other Ambulatory Visit: Payer: Self-pay | Admitting: Internal Medicine

## 2019-04-27 DIAGNOSIS — N6310 Unspecified lump in the right breast, unspecified quadrant: Secondary | ICD-10-CM

## 2019-04-27 DIAGNOSIS — R928 Other abnormal and inconclusive findings on diagnostic imaging of breast: Secondary | ICD-10-CM

## 2019-04-29 ENCOUNTER — Other Ambulatory Visit: Payer: Self-pay

## 2019-04-29 ENCOUNTER — Ambulatory Visit
Admission: RE | Admit: 2019-04-29 | Discharge: 2019-04-29 | Disposition: A | Discharge status: Home / Self Care | Payer: Managed Care, Other (non HMO) | Source: Ambulatory Visit | Attending: Internal Medicine | Admitting: Internal Medicine

## 2019-04-29 DIAGNOSIS — N6310 Unspecified lump in the right breast, unspecified quadrant: Secondary | ICD-10-CM

## 2019-04-29 HISTORY — PX: BREAST BIOPSY: SHX20

## 2019-11-16 ENCOUNTER — Telehealth: Payer: Self-pay

## 2019-11-16 NOTE — Telephone Encounter (Signed)
CALLED PT TO ADVISE THAT APPT FOR 03/12 HAS BEEN CANCELLED AND WILL NEED TO CALL [facility] TO RESCHEDULE AS [facility] HAS BEEN CLOSED ON FRIDAYS SINCE 11/2018

## 2019-11-30 ENCOUNTER — Encounter: Payer: Managed Care, Other (non HMO) | Admitting: Internal Medicine

## 2019-12-04 ENCOUNTER — Encounter: Payer: Managed Care, Other (non HMO) | Admitting: Nurse Practitioner

## 2020-02-08 ENCOUNTER — Other Ambulatory Visit: Payer: Self-pay

## 2020-02-08 ENCOUNTER — Emergency Department (HOSPITAL_BASED_OUTPATIENT_CLINIC_OR_DEPARTMENT_OTHER): Payer: Managed Care, Other (non HMO)

## 2020-02-08 ENCOUNTER — Encounter (HOSPITAL_BASED_OUTPATIENT_CLINIC_OR_DEPARTMENT_OTHER): Payer: Self-pay | Admitting: *Deleted

## 2020-02-08 ENCOUNTER — Emergency Department (HOSPITAL_BASED_OUTPATIENT_CLINIC_OR_DEPARTMENT_OTHER)
Admission: EM | Admit: 2020-02-08 | Discharge: 2020-02-08 | Disposition: A | Discharge status: Home / Self Care | Payer: Managed Care, Other (non HMO) | Attending: Emergency Medicine | Admitting: Emergency Medicine

## 2020-02-08 DIAGNOSIS — Z79899 Other long term (current) drug therapy: Secondary | ICD-10-CM | POA: Diagnosis not present

## 2020-02-08 DIAGNOSIS — X509XXA Other and unspecified overexertion or strenuous movements or postures, initial encounter: Secondary | ICD-10-CM | POA: Insufficient documentation

## 2020-02-08 DIAGNOSIS — M25511 Pain in right shoulder: Secondary | ICD-10-CM | POA: Insufficient documentation

## 2020-02-08 MED ORDER — NAPROXEN 500 MG PO TABS: 500.0000 mg | ORAL_TABLET | Freq: Two times a day (BID) | ORAL | 0 refills | Status: DC

## 2020-02-08 NOTE — ED Triage Notes (Signed)
Right upper arm pain x 2 days. Certain movements send "shock" feeling down her arm.

## 2020-02-08 NOTE — ED Provider Notes (Signed)
[place] EMERGENCY DEPARTMENT Provider Note   CSN: [id number] Arrival date & time: 02/08/20  1659     History Chief Complaint  Patient presents with  . Arm Pain    Yvonne May is a 44 y.o. female.  Patient presents to the emergency department with complaint of shooting pain from her right shoulder down into her right upper arm with certain specific positions starting today.  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  She denies any discrete injuries.  She denies any numbness or tingling.  When she is resting in a neutral position, she does not have any symptoms.  No neck pain.        History reviewed. No pertinent past medical history.  Patient Active Problem List   Diagnosis Date Noted  . Elevated blood pressure reading in office with diagnosis of hypertension 11/21/2018    History reviewed. No pertinent surgical history.   OB History   No obstetric history on file.     Family History  Problem Relation Age of Onset  . Breast cancer Mother 71    Social History   Tobacco Use  . Smoking status: Never Smoker  . Smokeless tobacco: Never Used  Substance Use Topics  . Alcohol use: Never  . Drug use: Never    Home Medications Prior to Admission medications   Medication Sig Start Date End Date Taking? Authorizing Provider  Ascorbic Acid (VITAMIN C) 1000 MG tablet Take 1,000 mg by mouth daily.   Yes [provider]  cholecalciferol (VITAMIN D3) 25 MCG (1000 UT) tablet Take 1,000 Units by mouth daily.   Yes [provider]  fluconazole (DIFLUCAN) 150 MG tablet Take one now repeat in 5 days 11/28/18   Minette Brine, FNP  hydroquinone 4 % cream Apply topically 2 (two) times daily. 01/28/19   Vanessa Kick, MD  metroNIDAZOLE (FLAGYL) 500 MG tablet Take 1 tablet (500 mg total) by mouth 2 (two) times daily. 11/28/18   Minette Brine, FNP  naproxen sodium (ALEVE) 220 MG tablet  Take 220 mg by mouth daily as needed.    [provider]    Allergies    Penicillins  Review of Systems   Review of Systems  Constitutional: Negative for activity change.  Musculoskeletal: Positive for myalgias. Negative for arthralgias, back pain, joint swelling and neck pain.  Skin: Negative for wound.  Neurological: Negative for weakness and numbness.    Physical Exam Updated Vital Signs BP (!) 148/99 (BP Location: Left Arm)   Pulse 78   Temp 98.8 F (37.1 C) (Oral)   Ht 5\' 7"  (1.702 m)   Wt 84.6 kg   SpO2 100%   BMI 29.21 kg/m   Physical Exam Vitals and nursing note reviewed.  Constitutional:      Appearance: She is well-developed.  HENT:     Head: Normocephalic and atraumatic.  Eyes:     Pupils: Pupils are equal, round, and reactive to light.  Cardiovascular:     Pulses: Normal pulses. No decreased pulses.  Musculoskeletal:        General: Tenderness present.     Right shoulder: No tenderness or bony tenderness. Decreased range of motion (Slightly decreased abduction). Normal strength.     Right upper arm: No tenderness.     Right elbow: Normal range of motion. No tenderness.     Right forearm: No tenderness.     Right wrist: No  tenderness. Normal range of motion.     Cervical back: Normal range of motion and neck supple. No tenderness. Normal range of motion.     Thoracic back: No tenderness. Normal range of motion.     Comments: Patient has difficulty finding the exact position which reproduces her symptoms.  She does seem to have some tenderness that increases with increasing abduction of the shoulder.  Skin:    General: Skin is warm and dry.  Neurological:     Mental Status: She is alert.     Sensory: No sensory deficit.     Comments: Motor, sensation, and vascular distal to the injury is fully intact.      ED Results / Procedures / Treatments   Labs (all labs ordered are listed, but only abnormal results are displayed) Labs Reviewed - No  data to display  EKG None  Radiology DG Shoulder Right  Result Date: 02/08/2020 CLINICAL DATA:  Right shoulder pain radiating down the right upper extremity. No known injury. EXAM: RIGHT SHOULDER - 2+ VIEW COMPARISON:  None. FINDINGS: Normal anatomic alignment. No evidence for acute fracture or dislocation. Visualized right hemithorax is unremarkable. IMPRESSION: No acute osseous abnormality. Electronically Signed   By: Annia Belt M.D.   On: 02/08/2020 17:44    Procedures Procedures (including critical care time)  Medications Ordered in ED Medications - No data to display  ED Course  I have reviewed the triage vital signs and the nursing notes.  Pertinent labs & imaging results that were available during my care of the patient were reviewed by me and considered in my medical decision making (see chart for details).  Patient seen and examined.  Suspect some form of nerve impingement in her right shoulder.  Will obtain x-ray.  Patient will likely need to continue anti-inflammatories.  She may benefit from sports medicine follow-up if symptoms or not improving.   Vital signs reviewed and are as follows: BP (!) 148/99 (BP Location: Left Arm)   Pulse 78   Temp 98.8 F (37.1 C) (Oral)   Ht 5\' 7"  (1.702 m)   Wt 84.6 kg   SpO2 100%   BMI 29.21 kg/m   5:52 PM x-ray results reviewed.  Patient updated on results.  Reiterated plan.  She is comfortable with this.  Encouraged return with any worsening.    MDM Rules/Calculators/A&P                      Patient with shooting pains, neuropathic in nature, down right shoulder and upper arm with certain positions.  Patient does a strenuous job with repetitive motions of her upper extremities.  Upper extremity is neurovascularly intact.  Imaging is negative.  RICE, NSAIDs indicated with sports medicine follow-up as needed.   Final Clinical Impression(s) / ED Diagnoses Final diagnoses:  Acute pain of right shoulder    Rx / DC Orders ED  Discharge Orders         Ordered    naproxen (NAPROSYN) 500 MG tablet  2 times daily     02/08/20 1745           02/10/20, PA-C 02/08/20 1753    02/10/20, MD 02/08/20 1929

## 2020-02-08 NOTE — Discharge Instructions (Signed)
Please read and follow all provided instructions.  Your diagnoses today include:  1. Acute pain of right shoulder     Tests performed today include:  An x-ray of the affected area - does NOT show any broken bones  Vital signs. See below for your results today.   Medications prescribed:   Naproxen - anti-inflammatory pain medication  Do not exceed 500mg  naproxen every 12 hours, take with food  You have been prescribed an anti-inflammatory medication or NSAID. Take with food. Take smallest effective dose for the shortest duration needed for your pain. Stop taking if you experience stomach pain or vomiting.   Take any prescribed medications only as directed.  Home care instructions:   Follow any educational materials contained in this packet  Follow R.I.C.E. Protocol:  R - rest your injury   I  - use ice on injury without applying directly to skin  C - compress injury with bandage or splint  E - elevate the injury as much as possible  Follow-up instructions: Please follow-up with your primary care provider or the provided orthopedic physician (bone specialist) if you continue to have significant pain in 1 week. In this case you may have a more severe injury that requires further care.   Return instructions:   Please return if your fingers are numb or tingling, appear gray or blue, or you have severe pain (also elevate the arm and loosen splint or wrap if you were given one)  Please return to the Emergency Department if you experience worsening symptoms.   Please return if you have any other emergent concerns.  Additional Information:  Your vital signs today were: BP (!) 148/99 (BP Location: Left Arm)   Pulse 78   Temp 98.8 F (37.1 C) (Oral)   Ht 5\' 7"  (1.702 m)   Wt 84.6 kg   SpO2 100%   BMI 29.21 kg/m  If your blood pressure (BP) was elevated above 135/85 this visit, please have this repeated by your doctor within one month. --------------

## 2020-02-16 ENCOUNTER — Ambulatory Visit: Payer: Managed Care, Other (non HMO) | Admitting: Nurse Practitioner

## 2020-03-30 ENCOUNTER — Telehealth: Payer: Self-pay | Admitting: General Practice

## 2020-03-30 NOTE — Telephone Encounter (Signed)
Received email request to schedule as new patient with Alysia Penna. LVM to call the office to schedule.

## 2020-04-05 ENCOUNTER — Other Ambulatory Visit: Payer: Self-pay | Admitting: Internal Medicine

## 2020-04-05 DIAGNOSIS — Z1231 Encounter for screening mammogram for malignant neoplasm of breast: Secondary | ICD-10-CM

## 2020-04-28 ENCOUNTER — Ambulatory Visit
Admission: RE | Admit: 2020-04-28 | Discharge: 2020-04-28 | Disposition: A | Discharge status: Home / Self Care | Payer: Managed Care, Other (non HMO) | Source: Ambulatory Visit

## 2020-04-28 ENCOUNTER — Other Ambulatory Visit: Payer: Self-pay

## 2020-04-28 DIAGNOSIS — Z1231 Encounter for screening mammogram for malignant neoplasm of breast: Secondary | ICD-10-CM

## 2020-05-06 ENCOUNTER — Ambulatory Visit: Payer: Managed Care, Other (non HMO) | Admitting: Nurse Practitioner

## 2020-06-07 ENCOUNTER — Other Ambulatory Visit: Payer: Self-pay

## 2020-06-07 ENCOUNTER — Ambulatory Visit (INDEPENDENT_AMBULATORY_CARE_PROVIDER_SITE_OTHER): Payer: Managed Care, Other (non HMO) | Admitting: Nurse Practitioner

## 2020-06-07 ENCOUNTER — Encounter: Payer: Self-pay | Admitting: Nurse Practitioner

## 2020-06-07 VITALS — BP 118/84 | HR 78 | Temp 97.7°F | Ht 65.5 in | Wt 180.0 lb

## 2020-06-07 DIAGNOSIS — L819 Disorder of pigmentation, unspecified: Secondary | ICD-10-CM | POA: Diagnosis not present

## 2020-06-07 DIAGNOSIS — Z90711 Acquired absence of uterus with remaining cervical stump: Secondary | ICD-10-CM | POA: Diagnosis not present

## 2020-06-07 DIAGNOSIS — Z13220 Encounter for screening for lipoid disorders: Secondary | ICD-10-CM

## 2020-06-07 DIAGNOSIS — Z0000 Encounter for general adult medical examination without abnormal findings: Secondary | ICD-10-CM

## 2020-06-07 DIAGNOSIS — Z136 Encounter for screening for cardiovascular disorders: Secondary | ICD-10-CM | POA: Diagnosis not present

## 2020-06-07 DIAGNOSIS — Z0001 Encounter for general adult medical examination with abnormal findings: Secondary | ICD-10-CM

## 2020-06-07 LAB — COMPREHENSIVE METABOLIC PANEL
ALT: 9 U/L (ref 0–35)
AST: 18 U/L (ref 0–37)
Albumin: 4.3 g/dL (ref 3.5–5.2)
Alkaline Phosphatase: 48 U/L (ref 39–117)
BUN: 10 mg/dL (ref 6–23)
CO2: 28 mEq/L (ref 19–32)
Calcium: 9.1 mg/dL (ref 8.4–10.5)
Chloride: 104 mEq/L (ref 96–112)
Creatinine, Ser: 0.75 mg/dL (ref 0.40–1.20)
GFR: 101.16 mL/min (ref >60.00)
Glucose, Bld: 80 mg/dL (ref 70–99)
Potassium: 4.4 mEq/L (ref 3.5–5.1)
Sodium: 139 mEq/L (ref 135–145)
Total Bilirubin: 0.4 mg/dL (ref 0.2–1.2)
Total Protein: 7.4 g/dL (ref 6.0–8.3)

## 2020-06-07 LAB — CBC WITH DIFFERENTIAL/PLATELET
Basophils Absolute: 0 10*3/uL (ref 0.0–0.1)
Basophils Relative: 0.3 % (ref 0.0–3.0)
Eosinophils Absolute: 0.2 10*3/uL (ref 0.0–0.7)
Eosinophils Relative: 3.7 % (ref 0.0–5.0)
HCT: 36.1 % (ref 36.0–46.0)
Hemoglobin: 11.4 g/dL — ABNORMAL LOW (ref 12.0–15.0)
Lymphocytes Relative: 43.4 % (ref 12.0–46.0)
Lymphs Abs: 2.8 10*3/uL (ref 0.7–4.0)
MCHC: 31.7 g/dL (ref 30.0–36.0)
MCV: 83.7 fl (ref 78.0–100.0)
Monocytes Absolute: 0.4 10*3/uL (ref 0.1–1.0)
Monocytes Relative: 6.1 % (ref 3.0–12.0)
Neutro Abs: 3 10*3/uL (ref 1.4–7.7)
Neutrophils Relative %: 46.5 % (ref 43.0–77.0)
Platelets: 201 10*3/uL (ref 150.0–400.0)
RBC: 4.31 Mil/uL (ref 3.87–5.11)
RDW: 14.1 % (ref 11.5–15.5)
WBC: 6.5 10*3/uL (ref 4.0–10.5)

## 2020-06-07 LAB — LIPID PANEL
Cholesterol: 152 mg/dL (ref 0–200)
HDL: 58 mg/dL (ref >39.00)
LDL Cholesterol: 85 mg/dL (ref 0–99)
NonHDL: 94.29
Total CHOL/HDL Ratio: 3
Triglycerides: 48 mg/dL (ref 0.0–149.0)
VLDL: 9.6 mg/dL (ref 0.0–40.0)

## 2020-06-07 LAB — TSH: TSH: 1.07 u[IU]/mL (ref 0.35–4.50)

## 2020-06-07 NOTE — Patient Instructions (Addendum)
Looks like you have an abnormal PAP with positive HPV in 2007, then benign surgical pathology from hysterectomy. This means you need to have 3documented normal PAPs prior to discontinue PAP screens. You are due for PAP in 2023 and 2026.  Go to lab for blood draw  Change dressing in 24hrs. keep the area dry for 24 hours and to contact us if he develops redness, drainage or swelling at the site.  Pt may use tylenol as needed for discomfort today.   Thank you for choosing [facility] for your health needs.  Health Maintenance, Female Adopting a healthy lifestyle and getting preventive care are important in promoting health and wellness. Ask your health care provider about:  The right schedule for you to have regular tests and exams.  Things you can do on your own to prevent diseases and keep yourself healthy. What should I know about diet, weight, and exercise? Eat a healthy diet   Eat a diet that includes plenty of vegetables, fruits, low-fat dairy products, and lean protein.  Do not eat a lot of foods that are high in solid fats, added sugars, or sodium. Maintain a healthy weight Body mass index (BMI) is used to identify weight problems. It estimates body fat based on height and weight. Your health care provider can help determine your BMI and help you achieve or maintain a healthy weight. Get regular exercise Get regular exercise. This is one of the most important things you can do for your health. Most adults should:  Exercise for at least 150 minutes each week. The exercise should increase your heart rate and make you sweat (moderate-intensity exercise).  Do strengthening exercises at least twice a week. This is in addition to the moderate-intensity exercise.  Spend less time sitting. Even light physical activity can be beneficial. Watch cholesterol and blood lipids Have your blood tested for lipids and cholesterol at 45 years of age, then have this test every 5  years. Have your cholesterol levels checked more often if:  Your lipid or cholesterol levels are high.  You are older than 45 years of age.  You are at high risk for heart disease. What should I know about cancer screening? Depending on your health history and family history, you may need to have cancer screening at various ages. This may include screening for:  Breast cancer.  Cervical cancer.  Colorectal cancer.  Skin cancer.  Lung cancer. What should I know about heart disease, diabetes, and high blood pressure? Blood pressure and heart disease  High blood pressure causes heart disease and increases the risk of stroke. This is more likely to develop in people who have high blood pressure readings, are of African descent, or are overweight.  Have your blood pressure checked: ? Every 3-5 years if you are 74-63 years of age. ? Every year if you are 48 years old or older. Diabetes Have regular diabetes screenings. This checks your fasting blood sugar level. Have the screening done:  Once every three years after age 63 if you are at a normal weight and have a low risk for diabetes.  More often and at a younger age if you are overweight or have a high risk for diabetes. What should I know about preventing infection? Hepatitis B If you have a higher risk for hepatitis B, you should be screened for this virus. Talk with your health care provider to find out if you are at risk for hepatitis B infection. Hepatitis C Testing is  recommended for:  Everyone born from [birth year] through 1966.  Anyone with known risk factors for hepatitis C. Sexually transmitted infections (STIs)  Get screened for STIs, including gonorrhea and chlamydia, if: ? You are sexually active and are younger than 45 years of age. ? You are older than 45 years of age and your health care provider tells you that you are at risk for this type of infection. ? Your sexual activity has changed since you were last  screened, and you are at increased risk for chlamydia or gonorrhea. Ask your health care provider if you are at risk.  Ask your health care provider about whether you are at high risk for HIV. Your health care provider may recommend a prescription medicine to help prevent HIV infection. If you choose to take medicine to prevent HIV, you should first get tested for HIV. You should then be tested every 3 months for as long as you are taking the medicine. Pregnancy  If you are about to stop having your period (premenopausal) and you may become pregnant, seek counseling before you get pregnant.  Take 400 to 800 micrograms (mcg) of folic acid every day if you become pregnant.  Ask for birth control (contraception) if you want to prevent pregnancy. Osteoporosis and menopause Osteoporosis is a disease in which the bones lose minerals and strength with aging. This can result in bone fractures. If you are 51 years old or older, or if you are at risk for osteoporosis and fractures, ask your health care provider if you should:  Be screened for bone loss.  Take a calcium or vitamin D supplement to lower your risk of fractures.  Be given hormone replacement therapy (HRT) to treat symptoms of menopause. Follow these instructions at home: Lifestyle  Do not use any products that contain nicotine or tobacco, such as cigarettes, e-cigarettes, and chewing tobacco. If you need help quitting, ask your health care provider.  Do not use street drugs.  Do not share needles.  Ask your health care provider for help if you need support or information about quitting drugs. Alcohol use  Do not drink alcohol if: ? Your health care provider tells you not to drink. ? You are pregnant, may be pregnant, or are planning to become pregnant.  If you drink alcohol: ? Limit how much you use to 0-1 drink a day. ? Limit intake if you are breastfeeding.  Be aware of how much alcohol is in your drink. In the [place] one  drink equals one 12 oz bottle of beer (355 mL), one 5 oz glass of wine (148 mL), or one 1 oz glass of hard liquor (44 mL). General instructions  Schedule regular health, dental, and eye exams.  Stay current with your vaccines.  Tell your health care provider if: ? You often feel depressed. ? You have ever been abused or do not feel safe at home. Summary  Adopting a healthy lifestyle and getting preventive care are important in promoting health and wellness.  Follow your health care provider's instructions about healthy diet, exercising, and getting tested or screened for diseases.  Follow your health care provider's instructions on monitoring your cholesterol and blood pressure. This information is not intended to replace advice given to you by your health care provider. Make sure you discuss any questions you have with your health care provider. Document Revised: 09/03/2018 Document Reviewed: 09/03/2018 Elsevier Patient Education  2020 [employer].

## 2020-06-07 NOTE — Assessment & Plan Note (Signed)
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 Next PAP due 2023 and 2026 prior to discontinuation No Fhx of uterine or cervical cancer.

## 2020-06-07 NOTE — Progress Notes (Signed)
Subjective:    Patient ID: Yvonne May, female    DOB: [birth year], 44 y.o.   MRN: [id number]  Patient presents today for CPE and  eval of skin lesion  Rash This is a new problem. The current episode started more than 1 month ago. The problem is unchanged. The affected locations include the abdomen. The rash is characterized by scaling and dryness. She was exposed to nothing. Pertinent negatives include no congestion, cough, diarrhea, fatigue, fever, joint pain, shortness of breath or sore throat. Past treatments include antibiotic cream. The treatment provided no relief. There is no history of allergies, eczema or varicella.   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 Up to date with mammogram  Depression/Suicide: Depression screen [facility] 2/9 06/07/2020  Decreased Interest 0  Down, Depressed, Hopeless 0  PHQ - 2 Score 0  Altered sleeping 0  Tired, decreased energy 1  Change in appetite 1  Feeling bad or failure about yourself  0  Trouble concentrating 0  Moving slowly or fidgety/restless 0  Suicidal thoughts 0  PHQ-9 Score 2  Difficult doing work/chores Not difficult at all   Vision:up to date, use of corrective lens  Dental:up to date  Immunizations: (TDAP, Hep C screen, Pneumovax, Influenza, zoster)  Health Maintenance  Topic Date Due  . Flu Shot  Never done  .  Hepatitis C: One time screening is recommended by Center for Disease Control  (CDC) for  adults born from [birth year] through 1966.   06/07/2021*  . Pap Smear  11/24/2021  . Tetanus Vaccine  01/16/2023  . COVID-19 Vaccine  Completed  . HIV Screening  Completed  *Topic was postponed. The date shown is not the original due date.   Diet:regular.  Weight:  Wt Readings from Last 3 Encounters:  06/07/20 180  lb (81.6 kg)  02/08/20 186 lb 8 oz (84.6 kg)  11/21/18 183 lb 6.4 oz (83.2 kg)   Medications and allergies reviewed with patient and updated if appropriate.  Patient Active Problem List   Diagnosis Date Noted  . S/P partial hysterectomy 06/07/2020  . Elevated blood pressure reading in office with diagnosis of hypertension 11/21/2018    Current Outpatient Medications on File Prior to Visit  Medication Sig Dispense Refill  . Ascorbic Acid (VITAMIN C) 1000 MG tablet Take 1,000 mg by mouth daily.    . cholecalciferol (VITAMIN D3) 25 MCG (1000 UT) tablet Take 1,000 Units by mouth daily.     No current facility-administered medications on file prior to visit.    History reviewed. No pertinent past medical history.  Past Surgical History:  Procedure Laterality Date  . 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 Marital status: Single    Spouse name: Not on file  . Number of children: Not on file  . Years of education: Not on file  . Highest education level: Not on file  Occupational History  . Not on file  Tobacco Use  . Smoking status: Never Smoker  . Smokeless tobacco: Never Used  Substance and Sexual Activity  . Alcohol use: Never  . Drug use: Never  . Sexual activity: Yes    Partners: Male    Birth control/protection: None  Other Topics Concern  . Not on file  Social History Narrative  .  Not on file   Social Determinants of Health   Financial Resource Strain:   . Difficulty of Paying Living Expenses: Not on file  Food Insecurity:   . Worried About Programme researcher, broadcasting/film/video in the Last Year: Not on file  . Ran Out of Food in the Last Year: Not on file  Transportation Needs:   . Lack of Transportation (Medical): Not on file  . Lack of Transportation (Non-Medical): Not on file  Physical Activity:   . Days of Exercise per Week: Not on file  . Minutes of Exercise per Session: Not on file  Stress:   . Feeling of Stress  : Not on file  Social Connections:   . Frequency of Communication with Friends and Family: Not on file  . Frequency of Social Gatherings with Friends and Family: Not on file  . Attends Religious Services: Not on file  . Active Member of Clubs or Organizations: Not on file  . Attends Banker Meetings: Not on file  . Marital Status: Not on file    Family History  Problem Relation Age of Onset  . Breast cancer Mother 37       Review of Systems  Constitutional: Negative for fatigue, fever, malaise/fatigue and weight loss.  HENT: Negative for congestion and sore throat.   Eyes:       Negative for visual changes  Respiratory: Negative for cough and shortness of breath.   Cardiovascular: Negative for chest pain, palpitations and leg swelling.  Gastrointestinal: Negative for blood in stool, constipation, diarrhea and heartburn.  Genitourinary: Negative for dysuria, frequency and urgency.  Musculoskeletal: Negative for falls, joint pain and myalgias.  Skin: Positive for rash.  Neurological: Negative for dizziness, sensory change and headaches.  Endo/Heme/Allergies: Does not bruise/bleed easily.  Psychiatric/Behavioral: Negative for depression, substance abuse and suicidal ideas. The patient is not nervous/anxious.     Objective:   Vitals:   06/07/20 1307  BP: 118/84  Pulse: 78  Temp: 97.7 F (36.5 C)  SpO2: 99%    Body mass index is 29.5 kg/m.   Physical Examination:  Physical Exam Vitals and nursing note reviewed. Exam conducted with a chaperone present.  Constitutional:      General: She is not in acute distress.    Appearance: She is well-developed.  HENT:     Right Ear: Tympanic membrane, ear canal and external ear normal.     Left Ear: Tympanic membrane, ear canal and external ear normal.  Eyes:     Extraocular Movements: Extraocular movements intact.     Conjunctiva/sclera: Conjunctivae normal.  Cardiovascular:     Rate and Rhythm: Normal rate  and regular rhythm.     Pulses: Normal pulses.     Heart sounds: Normal heart sounds.  Pulmonary:     Effort: Pulmonary effort is normal. No respiratory distress.     Breath sounds: Normal breath sounds.  Chest:     Chest wall: No tenderness.  Abdominal:     General: Bowel sounds are normal.     Palpations: Abdomen is soft.  Genitourinary:    Comments: Deferred to next year Musculoskeletal:        General: Normal range of motion.     Cervical back: Normal range of motion and neck supple.     Right lower leg: No edema.     Left lower leg: No edema.  Lymphadenopathy:     Cervical: No cervical adenopathy.  Skin:    General: Skin is warm  and dry.     Findings: Lesion present. No erythema or rash.       Neurological:     Mental Status: She is alert and oriented to person, place, and time.     Deep Tendon Reflexes: Reflexes are normal and symmetric.  Psychiatric:        Mood and Affect: Mood normal.        Behavior: Behavior normal.    ASSESSMENT and PLAN: This visit occurred during the SARS-CoV-2 public health emergency.  Safety protocols were in place, including screening questions prior to the visit, additional usage of staff PPE, and extensive cleaning of exam room while observing appropriate contact time as indicated for disinfecting solutions.   Yvonne May was seen today for establish care.  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 Next PAP due 2023 and 2026 prior to discontinuation No Fhx of uterine or cervical cancer.        Other Visit Diagnoses    Encounter for preventative adult health care exam with abnormal findings    -  Primary   Relevant Orders   CBC with Differential/Platelet   Comprehensive metabolic panel   Lipid panel   TSH   Atypical pigmented skin lesion       Relevant Orders   Pathology Report ([facility])   Encounter for lipid screening for cardiovascular disease       Relevant Orders   Lipid panel      Punch Biopsy of rash on Abdomen Procedure including risks/benefits explained to patient.   Questions were answered.  After informed consent was obtained and a time out completed. Site was cleansed with betadine and then alcohol. 1% Lidocaine with epinephrine was injected under lesion and then 22mm punch biopsy was performed. Pressure dressing applied to obtain hemostasis.  Pt tolerated procedure well.  Specimen sent for pathology review.  Pt instructed to keep the area dry for 24 hours and to contact us if he develops redness, drainage or swelling at the site.  Pt may use tylenol as needed for discomfort today.   Follow up: Return in about 1 year (around 06/07/2021) for CPE (fasting).  Alysia Penna, NP

## 2020-06-10 LAB — PATHOLOGY REPORT

## 2020-09-28 ENCOUNTER — Other Ambulatory Visit: Payer: Self-pay

## 2020-09-28 ENCOUNTER — Encounter: Payer: Self-pay | Admitting: Nurse Practitioner

## 2020-09-28 ENCOUNTER — Ambulatory Visit (INDEPENDENT_AMBULATORY_CARE_PROVIDER_SITE_OTHER): Payer: Managed Care, Other (non HMO) | Admitting: Nurse Practitioner

## 2020-09-28 VITALS — BP 132/88 | HR 75 | Temp 96.8°F | Ht 65.0 in | Wt 186.6 lb

## 2020-09-28 DIAGNOSIS — H9191 Unspecified hearing loss, right ear: Secondary | ICD-10-CM | POA: Diagnosis not present

## 2020-09-28 DIAGNOSIS — H938X1 Other specified disorders of right ear: Secondary | ICD-10-CM

## 2020-09-28 DIAGNOSIS — H6991 Unspecified Eustachian tube disorder, right ear: Secondary | ICD-10-CM

## 2020-09-28 DIAGNOSIS — H6981 Other specified disorders of Eustachian tube, right ear: Secondary | ICD-10-CM

## 2020-09-28 MED ORDER — CETIRIZINE HCL 10 MG PO TABS: 10.0000 mg | ORAL_TABLET | Freq: Every day | ORAL | 0 refills | Status: DC

## 2020-09-28 MED ORDER — FLUTICASONE PROPIONATE 50 MCG/ACT NA SUSP: 2.0000 | Freq: Every day | NASAL | 0 refills | Status: DC

## 2020-09-28 MED ORDER — HYDROCORTISONE ACETATE 25 MG RE SUPP: 25.0000 mg | Freq: Two times a day (BID) | RECTAL | 0 refills | Status: DC

## 2020-09-28 NOTE — Progress Notes (Signed)
Subjective:  Patient ID: Yvonne May, female    DOB: 06-11-1976  Age: 45 y.o. MRN: [id number]  CC: Acute Visit (Pt c/o right ear swelling x3 days. Pt states yesterday she felt like it was swollen shut. Pt states the ear pain has been causing headaches. Denies any known injury. )  Ear Fullness  There is pain in the right ear. This is a new problem. The current episode started in the past 7 days. The problem occurs constantly. The problem has been resolved. There has been no fever. The pain is moderate. Associated symptoms include headaches and hearing loss. Pertinent negatives include no coughing, ear discharge, neck pain, rash, rhinorrhea, sore throat or vomiting. She has tried nothing for the symptoms. There is no history of a chronic ear infection, hearing loss or a tympanostomy tube.    Hearing Screening   125Hz  250Hz  500Hz  1000Hz  2000Hz  3000Hz  4000Hz  6000Hz  8000Hz   Right ear:   y y y n n n n  Left ear:   y y y y y y y   Reviewed past Medical, Social and Family history today.  Outpatient Medications Prior to Visit  Medication Sig Dispense Refill  . Ascorbic Acid (VITAMIN C) 1000 MG tablet Take 1,000 mg by mouth daily.    . cholecalciferol (VITAMIN D3) 25 MCG (1000 UT) tablet Take 1,000 Units by mouth daily.     No facility-administered medications prior to visit.    ROS See HPI  Objective:  BP 132/88 (BP Location: Left Arm, Patient Position: Sitting, Cuff Size: Large)   Pulse 75   Temp (!) 96.8 F (36 C) (Temporal)   Ht 5\' 5"  (1.651 m)   Wt 186 lb 9.6 oz (84.6 kg)   SpO2 96%   BMI 31.05 kg/m   Physical Exam Vitals reviewed.  HENT:     Head: Normocephalic.     Jaw: There is normal jaw occlusion.     Salivary Glands: Right salivary gland is not diffusely enlarged or tender. Left salivary gland is not diffusely enlarged or tender.     Right Ear: Ear canal and external ear normal. Decreased hearing noted. No drainage, swelling or tenderness. A middle ear effusion  is present. There is no impacted cerumen. No foreign body. No mastoid tenderness. Tympanic membrane is not injected, scarred, perforated, erythematous or bulging.     Left Ear: Hearing, ear canal and external ear normal. No drainage, swelling or tenderness. A middle ear effusion is present. There is no impacted cerumen. No foreign body. No mastoid tenderness. Tympanic membrane is not injected, scarred, perforated, erythematous or bulging.     Ears:     Weber exam findings: lateralizes right.    Right Rinne: AC > BC.    Left Rinne: AC > BC. Neck:     Thyroid: No thyroid mass, thyromegaly or thyroid tenderness.  Pulmonary:     Effort: Pulmonary effort is normal.  Musculoskeletal:     Cervical back: Normal range of motion and neck supple.  Lymphadenopathy:     Cervical: No cervical adenopathy.  Skin:    Findings: No erythema or rash.    Assessment & Plan:  This visit occurred during the SARS-CoV-2 public health emergency.  Safety protocols were in place, including screening questions prior to the visit, additional usage of staff PPE, and extensive cleaning of exam room while observing appropriate contact time as indicated for disinfecting solutions.   Yvonne May was seen today for acute visit.  Diagnoses and all orders for  this visit:  Ear fullness, right -     Discontinue: fluticasone (FLONASE) 50 MCG/ACT nasal spray; Place 2 sprays into both nostrils daily. -     cetirizine (ZYRTEC) 10 MG tablet; Take 1 tablet (10 mg total) by mouth daily. -     Ambulatory referral to Audiology -     fluticasone (FLONASE) 50 MCG/ACT nasal spray; Place 2 sprays into both nostrils daily.  Hearing loss of right ear, unspecified hearing loss type -     Ambulatory referral to Audiology  Eustachian tube dysfunction, right -     Discontinue: fluticasone (FLONASE) 50 MCG/ACT nasal spray; Place 2 sprays into both nostrils daily. -     cetirizine (ZYRTEC) 10 MG tablet; Take 1 tablet (10 mg total) by mouth  daily. -     fluticasone (FLONASE) 50 MCG/ACT nasal spray; Place 2 sprays into both nostrils daily.  Other orders -     Discontinue: hydrocortisone (ANUSOL-HC) 25 MG suppository; Place 1 suppository (25 mg total) rectally 2 (two) times daily. -     Cancel: IFOBT POC (occult bld, rslt in office)    Problem List Items Addressed This Visit   None   Visit Diagnoses    Ear fullness, right    -  Primary   Relevant Medications   cetirizine (ZYRTEC) 10 MG tablet   fluticasone (FLONASE) 50 MCG/ACT nasal spray   Other Relevant Orders   Ambulatory referral to Audiology   Hearing loss of right ear, unspecified hearing loss type       Relevant Orders   Ambulatory referral to Audiology   Eustachian tube dysfunction, right       Relevant Medications   cetirizine (ZYRTEC) 10 MG tablet   fluticasone (FLONASE) 50 MCG/ACT nasal spray      Follow-up: No follow-ups on file.  Alysia Penna, NP

## 2020-09-28 NOTE — Patient Instructions (Addendum)
You will be contacted to schedule an appt with audiology

## 2020-11-13 IMAGING — MG DIGITAL SCREENING BILATERAL MAMMOGRAM WITH TOMO AND CAD
7 series · 8 of 19 positions shown · non-contrast
Comparison: Previous exam(s).

CLINICAL DATA: Screening.

EXAM:
DIGITAL SCREENING BILATERAL MAMMOGRAM WITH TOMO AND CAD

[R CC synth-2D]
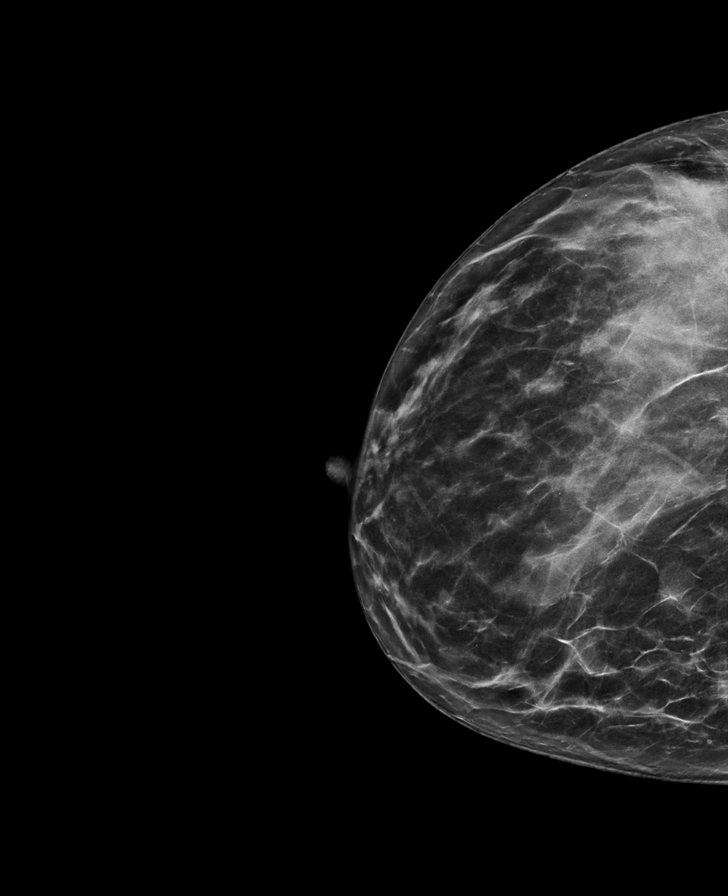

[L CC synth-2D]
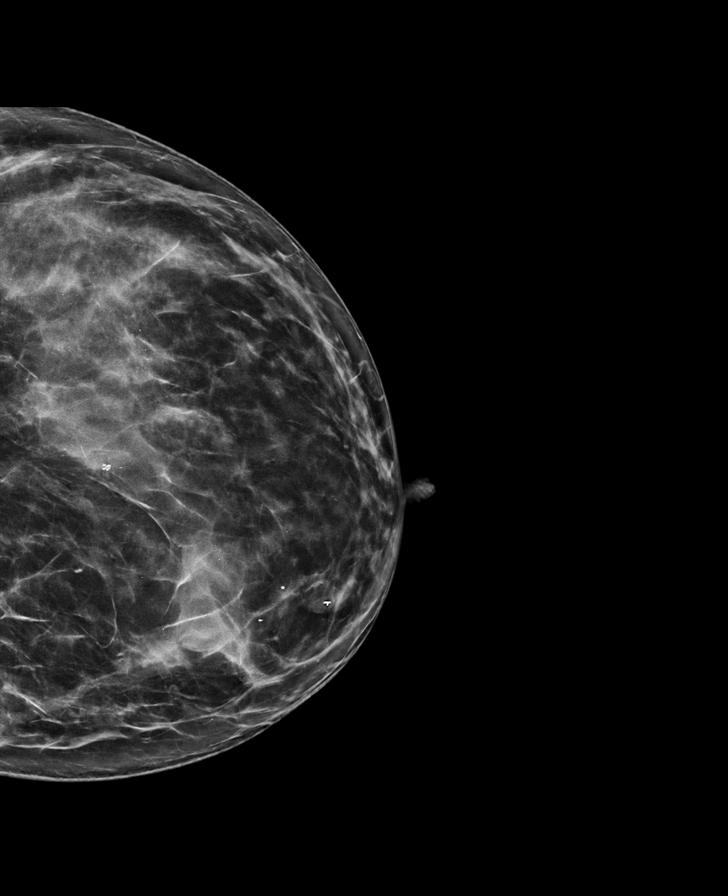

[L MLO synth-2D]
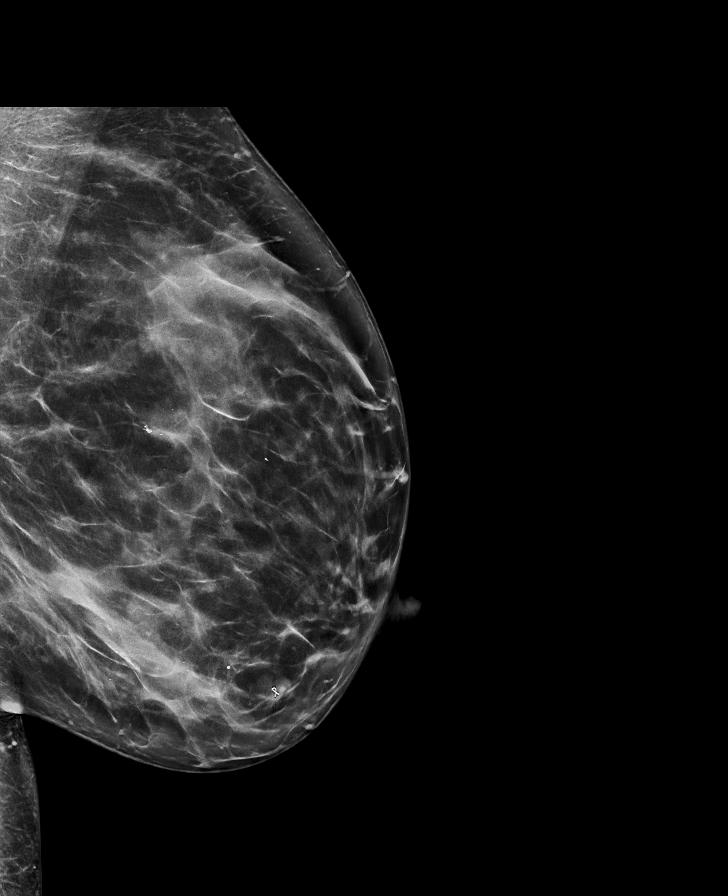

[R MLO synth-2D]
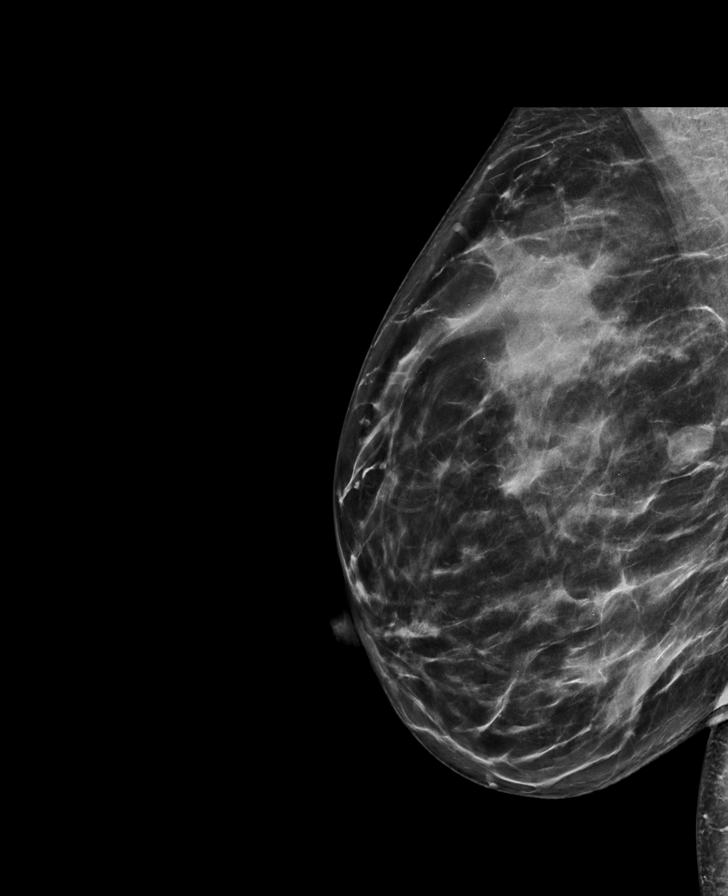

[L CC tomo · 2 of 75 frames shown]
[frame 25/75]
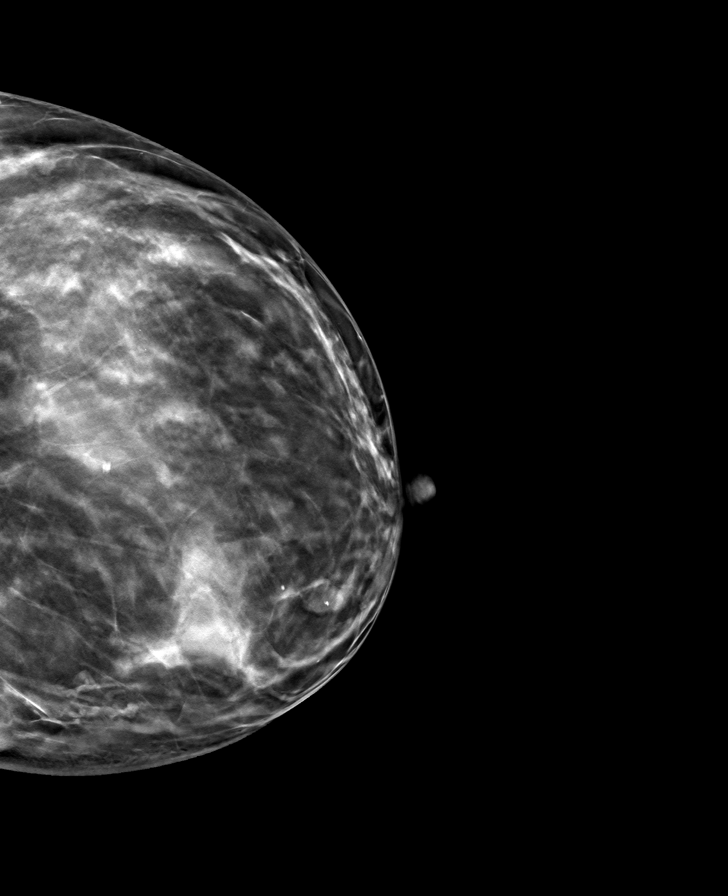
[frame 38/75]
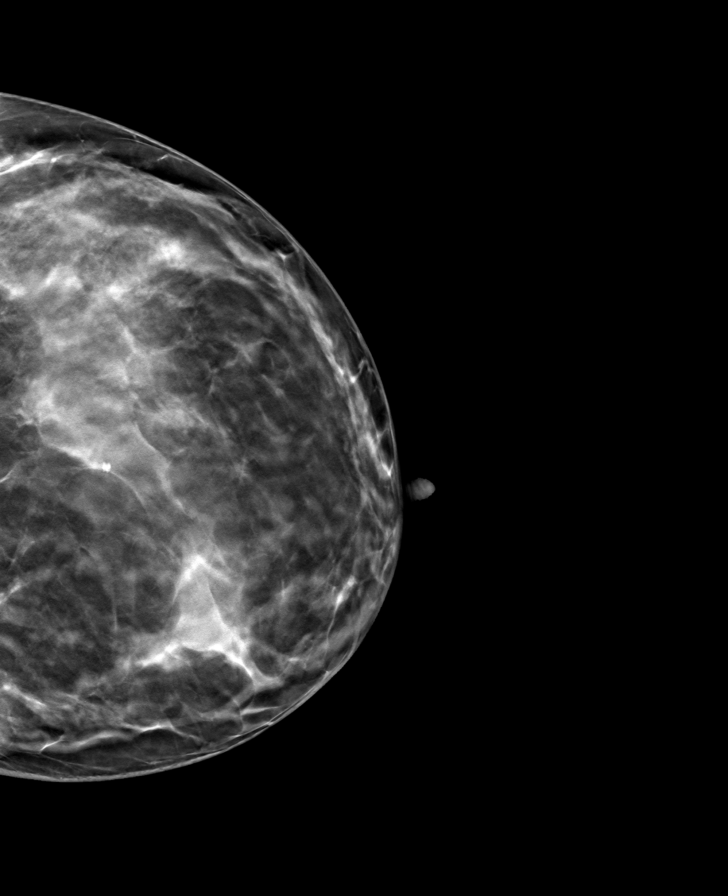

[R MLO tomo · tomo slice 39/77.0]
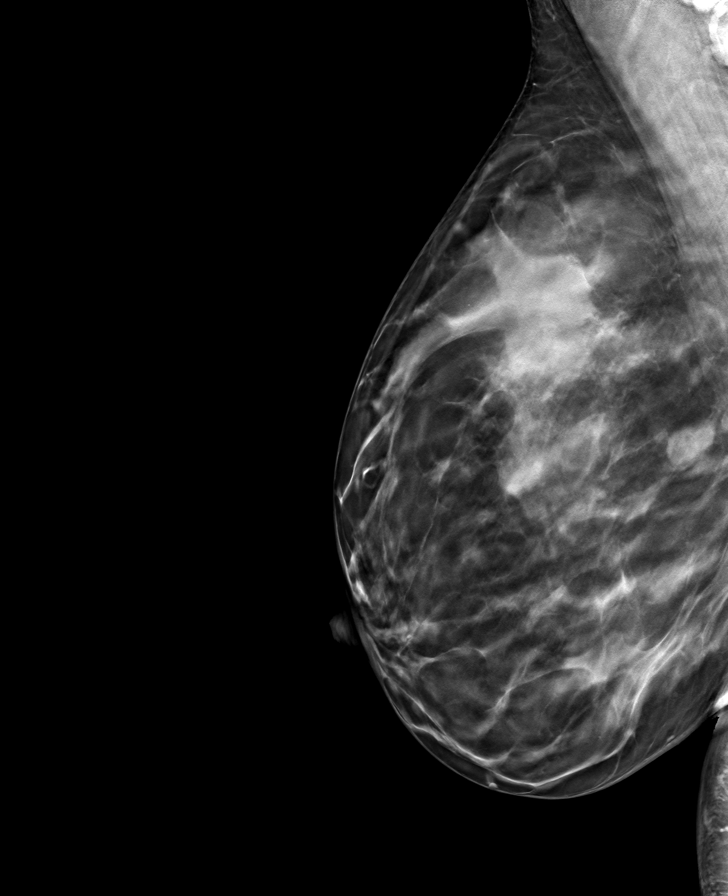

[R CC tomo · tomo slice 36/71.0]
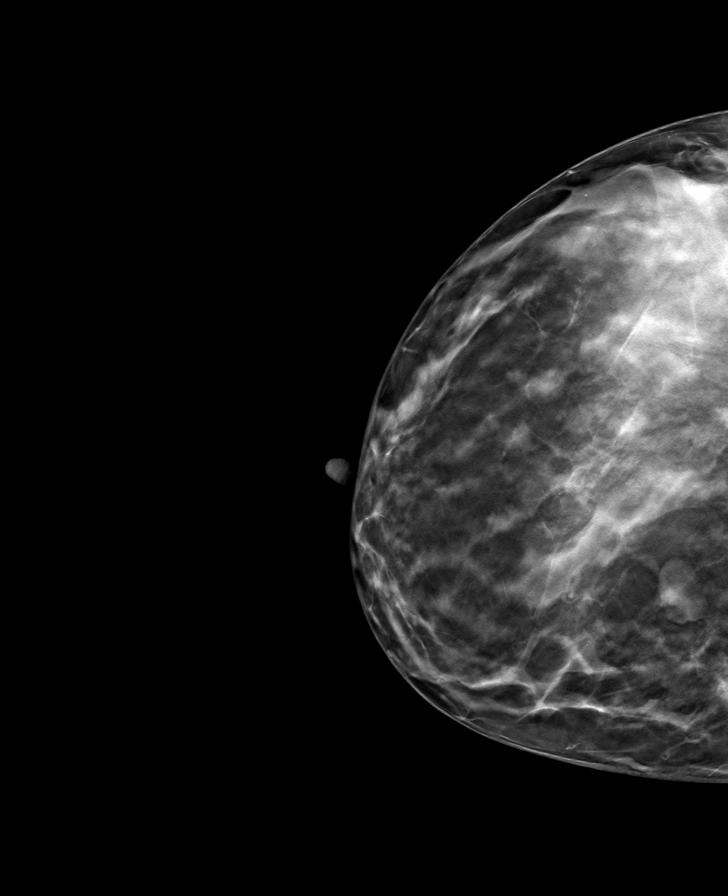

[8 of 19 positions shown; findings below may reference images not displayed]

ACR Breast Density Category c: The breast tissue is heterogeneously
dense, which may obscure small masses.
FINDINGS: In the right breast, a possible mass warrants further evaluation. In
the left breast, no findings suspicious for malignancy. Images were
processed with CAD.
IMPRESSION: Further evaluation is suggested for possible mass in the right
breast.

RECOMMENDATION:
Diagnostic mammogram and possibly ultrasound of the right breast.
(Code:[id number])

The patient will be contacted regarding the findings, and additional
imaging will be scheduled.

BI-RADS CATEGORY  0: Incomplete. Need additional imaging evaluation
and/or prior mammograms for comparison.

## 2021-05-25 ENCOUNTER — Other Ambulatory Visit: Payer: Self-pay | Admitting: Nurse Practitioner

## 2021-05-25 DIAGNOSIS — Z1231 Encounter for screening mammogram for malignant neoplasm of breast: Secondary | ICD-10-CM

## 2021-06-28 ENCOUNTER — Other Ambulatory Visit: Payer: Self-pay

## 2021-06-28 ENCOUNTER — Ambulatory Visit
Admission: RE | Admit: 2021-06-28 | Discharge: 2021-06-28 | Disposition: A | Discharge status: Home / Self Care | Payer: Managed Care, Other (non HMO) | Source: Ambulatory Visit

## 2021-06-28 DIAGNOSIS — Z1231 Encounter for screening mammogram for malignant neoplasm of breast: Secondary | ICD-10-CM

## 2022-07-10 ENCOUNTER — Other Ambulatory Visit (HOSPITAL_COMMUNITY)
Admission: RE | Admit: 2022-07-10 | Discharge: 2022-07-10 | Disposition: A | Discharge status: Home / Self Care | Payer: Managed Care, Other (non HMO) | Source: Ambulatory Visit | Attending: Nurse Practitioner | Admitting: Nurse Practitioner

## 2022-07-10 ENCOUNTER — Encounter: Payer: Self-pay | Admitting: Nurse Practitioner

## 2022-07-10 ENCOUNTER — Ambulatory Visit (INDEPENDENT_AMBULATORY_CARE_PROVIDER_SITE_OTHER): Payer: Managed Care, Other (non HMO) | Admitting: Nurse Practitioner

## 2022-07-10 VITALS — BP 124/82 | HR 63 | Temp 97.3°F | Ht 65.0 in | Wt 177.6 lb

## 2022-07-10 DIAGNOSIS — Z136 Encounter for screening for cardiovascular disorders: Secondary | ICD-10-CM

## 2022-07-10 DIAGNOSIS — K648 Other hemorrhoids: Secondary | ICD-10-CM | POA: Insufficient documentation

## 2022-07-10 DIAGNOSIS — Z113 Encounter for screening for infections with a predominantly sexual mode of transmission: Secondary | ICD-10-CM

## 2022-07-10 DIAGNOSIS — Z1211 Encounter for screening for malignant neoplasm of colon: Secondary | ICD-10-CM | POA: Diagnosis not present

## 2022-07-10 DIAGNOSIS — K644 Residual hemorrhoidal skin tags: Secondary | ICD-10-CM | POA: Insufficient documentation

## 2022-07-10 DIAGNOSIS — Z13220 Encounter for screening for lipoid disorders: Secondary | ICD-10-CM | POA: Diagnosis not present

## 2022-07-10 DIAGNOSIS — Z0000 Encounter for general adult medical examination without abnormal findings: Secondary | ICD-10-CM | POA: Diagnosis not present

## 2022-07-10 LAB — COMPREHENSIVE METABOLIC PANEL
ALT: 9 U/L (ref 0–35)
AST: 17 U/L (ref 0–37)
Albumin: 4.3 g/dL (ref 3.5–5.2)
Alkaline Phosphatase: 57 U/L (ref 39–117)
BUN: 10 mg/dL (ref 6–23)
CO2: 25 mEq/L (ref 19–32)
Calcium: 9.5 mg/dL (ref 8.4–10.5)
Chloride: 104 mEq/L (ref 96–112)
Creatinine, Ser: 0.78 mg/dL (ref 0.40–1.20)
GFR: 90.72 mL/min (ref >60.00)
Glucose, Bld: 64 mg/dL — ABNORMAL LOW (ref 70–99)
Potassium: 3.8 mEq/L (ref 3.5–5.1)
Sodium: 139 mEq/L (ref 135–145)
Total Bilirubin: 0.3 mg/dL (ref 0.2–1.2)
Total Protein: 7.9 g/dL (ref 6.0–8.3)

## 2022-07-10 LAB — TSH: TSH: 1.29 u[IU]/mL (ref 0.35–5.50)

## 2022-07-10 LAB — CBC
HCT: 40 % (ref 36.0–46.0)
Hemoglobin: 12.6 g/dL (ref 12.0–15.0)
MCHC: 31.5 g/dL (ref 30.0–36.0)
MCV: 82.5 fl (ref 78.0–100.0)
Platelets: 195 10*3/uL (ref 150.0–400.0)
RBC: 4.85 Mil/uL (ref 3.87–5.11)
RDW: 14.3 % (ref 11.5–15.5)
WBC: 5.7 10*3/uL (ref 4.0–10.5)

## 2022-07-10 NOTE — Patient Instructions (Addendum)
Schedule appt for mammogram You will be contacted to schedule appt with GI Go to lab  Preventive Care 63-47 Years Old, Female Preventive care refers to lifestyle choices and visits with your health care provider that can promote health and wellness. Preventive care visits are also called wellness exams. What can I expect for my preventive care visit? Counseling Your health care provider may ask you questions about your: Medical history, including: Past medical problems. Family medical history. Pregnancy history. Current health, including: Menstrual cycle. Method of birth control. Emotional well-being. Home life and relationship well-being. Sexual activity and sexual health. Lifestyle, including: Alcohol, nicotine or tobacco, and drug use. Access to firearms. Diet, exercise, and sleep habits. Work and work Statistician. Sunscreen use. Safety issues such as seatbelt and bike helmet use. Physical exam Your health care provider will check your: Height and weight. These may be used to calculate your BMI (body mass index). BMI is a measurement that tells if you are at a healthy weight. Waist circumference. This measures the distance around your waistline. This measurement also tells if you are at a healthy weight and may help predict your risk of certain diseases, such as type 2 diabetes and high blood pressure. Heart rate and blood pressure. Body temperature. Skin for abnormal spots. What immunizations do I need?  Vaccines are usually given at various ages, according to a schedule. Your health care provider will recommend vaccines for you based on your age, medical history, and lifestyle or other factors, such as travel or where you work. What tests do I need? Screening Your health care provider may recommend screening tests for certain conditions. This may include: Lipid and cholesterol levels. Diabetes screening. This is done by checking your blood sugar (glucose) after you have  not eaten for a while (fasting). Pelvic exam and Pap test. Hepatitis B test. Hepatitis C test. HIV (human immunodeficiency virus) test. STI (sexually transmitted infection) testing, if you are at risk. Lung cancer screening. Colorectal cancer screening. Mammogram. Talk with your health care provider about when you should start having regular mammograms. This may depend on whether you have a family history of breast cancer. BRCA-related cancer screening. This may be done if you have a family history of breast, ovarian, tubal, or peritoneal cancers. Bone density scan. This is done to screen for osteoporosis. Talk with your health care provider about your test results, treatment options, and if necessary, the need for more tests. Follow these instructions at home: Eating and drinking  Eat a diet that includes fresh fruits and vegetables, whole grains, lean protein, and low-fat dairy products. Take vitamin and mineral supplements as recommended by your health care provider. Do not drink alcohol if: Your health care provider tells you not to drink. You are pregnant, may be pregnant, or are planning to become pregnant. If you drink alcohol: Limit how much you have to 0-1 drink a day. Know how much alcohol is in your drink. In the [place] one drink equals one 12 oz bottle of beer (355 mL), one 5 oz glass of wine (148 mL), or one 1 oz glass of hard liquor (44 mL). Lifestyle Brush your teeth every morning and night with fluoride toothpaste. Floss one time each day. Exercise for at least 30 minutes 5 or more days each week. Do not use any products that contain nicotine or tobacco. These products include cigarettes, chewing tobacco, and vaping devices, such as e-cigarettes. If you need help quitting, ask your health care provider. Do not use drugs.  If you are sexually active, practice safe sex. Use a condom or other form of protection to prevent STIs. If you do not wish to become pregnant, use a  form of birth control. If you plan to become pregnant, see your health care provider for a prepregnancy visit. Take aspirin only as told by your health care provider. Make sure that you understand how much to take and what form to take. Work with your health care provider to find out whether it is safe and beneficial for you to take aspirin daily. Find healthy ways to manage stress, such as: Meditation, yoga, or listening to music. Journaling. Talking to a trusted person. Spending time with friends and family. Minimize exposure to UV radiation to reduce your risk of skin cancer. Safety Always wear your seat belt while driving or riding in a vehicle. Do not drive: If you have been drinking alcohol. Do not ride with someone who has been drinking. When you are tired or distracted. While texting. If you have been using any mind-altering substances or drugs. Wear a helmet and other protective equipment during sports activities. If you have firearms in your house, make sure you follow all gun safety procedures. Seek help if you have been physically or sexually abused. What's next? Visit your health care provider once a year for an annual wellness visit. Ask your health care provider how often you should have your eyes and teeth checked. Stay up to date on all vaccines. This information is not intended to replace advice given to you by your health care provider. Make sure you discuss any questions you have with your health care provider. Document Revised: 03/08/2021 Document Reviewed: 03/08/2021 Elsevier Patient Education  [place].

## 2022-07-10 NOTE — Progress Notes (Signed)
Complete physical exam  Patient: Yvonne May   DOB: [birth year]   46 y.o. Female  MRN: [id number] Visit Date: 07/10/2022  Subjective:    Chief Complaint  Patient presents with   Annual Exam    CPE Pt not fasting  C/o of having diabetes. Pap/ STD testing    Yvonne May is a 46 y.o. female who presents today for a complete physical exam. She reports consuming a general diet.  No regular exercise regimen  She generally feels well. She reports sleeping well. She does not have additional problems to discuss today.  Vision:No Dental:Yes STD Screen:Yes  Wt Readings from Last 3 Encounters:  07/10/22 177 lb 9.6 oz (80.6 kg)  09/28/20 186 lb 9.6 oz (84.6 kg)  06/07/20 180 lb (81.6 kg)    BP Readings from Last 3 Encounters:  07/10/22 124/82  09/28/20 132/88  06/07/20 118/84    Most recent fall risk assessment:    07/10/2022    1:06 PM  Fall Risk   Falls in the past year? 0  Number falls in past yr: 0  Injury with Fall? 0     Most recent depression screenings:    07/10/2022    1:31 PM 06/07/2020    1:51 PM  PHQ 2/9 Scores  PHQ - 2 Score 0 0  PHQ- 9 Score 0 2   HPI  No problem-specific Assessment & Plan notes found for this encounter.  History reviewed. No pertinent past medical history. Past Surgical History:  Procedure Laterality Date   BREAST BIOPSY Right 04/29/2019   Fibroadenoma   Social History   Socioeconomic History   Marital status: Single    Spouse name: Not on file   Number of children: Not on file   Years of education: Not on file   Highest education level: Not on file  Occupational History   Not on file  Tobacco Use   Smoking status: Never   Smokeless tobacco: Never  Vaping Use   Vaping Use: Never used  Substance and Sexual Activity   Alcohol use: Never   Drug use: Never   Sexual activity: Yes    Partners: Male    Birth control/protection: None  Other Topics Concern   Not on file  Social History Narrative   Not on file    Social Determinants of Health   Financial Resource Strain: Not on file  Food Insecurity: Not on file  Transportation Needs: Not on file  Physical Activity: Not on file  Stress: Not on file  Social Connections: Not on file  Intimate Partner Violence: Not on file   Family Status  Relation Name Status   Mother  Alive   Father  Alive   Sister  Alive   Brother  Alive   Family History  Problem Relation Age of Onset   Breast cancer Mother 28   Diabetes Mother    Hypertension Mother    Hypertension Father    Diabetes Father    Diabetes Brother    Allergies  Allergen Reactions   Penicillins     Since childhood    Patient Care Team: Gene Glazebrook, Bonna Gains, NP as PCP - General (Internal Medicine)   Medications: Outpatient Medications Prior to Visit  Medication Sig   Ascorbic Acid (VITAMIN C) 1000 MG tablet Take 1,000 mg by mouth daily.   cholecalciferol (VITAMIN D3) 25 MCG (1000 UT) tablet Take 1,000 Units by mouth daily.   [DISCONTINUED] cetirizine (ZYRTEC) 10 MG tablet Take 1 tablet (  10 mg total) by mouth daily. (Patient not taking: Reported on 07/10/2022)   [DISCONTINUED] fluticasone (FLONASE) 50 MCG/ACT nasal spray Place 2 sprays into both nostrils daily. (Patient not taking: Reported on 07/10/2022)   No facility-administered medications prior to visit.    Review of Systems  Constitutional:  Negative for fever.  HENT:  Negative for congestion and sore throat.   Eyes:        Negative for visual changes  Respiratory:  Negative for cough and shortness of breath.   Cardiovascular:  Negative for chest pain, palpitations and leg swelling.  Gastrointestinal:  Negative for blood in stool, constipation and diarrhea.  Genitourinary:  Negative for dysuria, frequency and urgency.  Musculoskeletal:  Negative for myalgias.  Skin:  Negative for rash.  Neurological:  Negative for dizziness and headaches.  Hematological:  Does not bruise/bleed easily.  Psychiatric/Behavioral:   Negative for suicidal ideas. The patient is not nervous/anxious.        Objective:  BP 124/82 (BP Location: Right Arm, Patient Position: Sitting, Cuff Size: Normal)   Pulse 63   Temp (!) 97.3 F (36.3 C) (Temporal)   Ht 5\' 5"  (1.651 m)   Wt 177 lb 9.6 oz (80.6 kg)   SpO2 99%   BMI 29.55 kg/m     Physical Exam Vitals and nursing note reviewed.  Constitutional:      General: She is not in acute distress.    Appearance: She is well-developed. She is obese.  HENT:     Right Ear: Tympanic membrane, ear canal and external ear normal.     Left Ear: Tympanic membrane, ear canal and external ear normal.     Nose: Nose normal.  Eyes:     Extraocular Movements: Extraocular movements intact.     Conjunctiva/sclera: Conjunctivae normal.  Cardiovascular:     Rate and Rhythm: Normal rate and regular rhythm.     Pulses: Normal pulses.     Heart sounds: Normal heart sounds.  Pulmonary:     Effort: Pulmonary effort is normal. No respiratory distress.     Breath sounds: Normal breath sounds.  Chest:     Chest wall: No tenderness.  Breasts:    Breasts are symmetrical.     Right: Normal.     Left: Normal.  Abdominal:     General: Bowel sounds are normal.     Palpations: Abdomen is soft.  Musculoskeletal:        General: Normal range of motion.     Cervical back: Normal range of motion and neck supple.     Right lower leg: No edema.     Left lower leg: No edema.  Lymphadenopathy:     Upper Body:     Right upper body: No supraclavicular, axillary or pectoral adenopathy.     Left upper body: No supraclavicular, axillary or pectoral adenopathy.  Skin:    General: Skin is warm and dry.  Neurological:     Mental Status: She is alert and oriented to person, place, and time.     Deep Tendon Reflexes: Reflexes are normal and symmetric.  Psychiatric:        Mood and Affect: Mood normal.        Behavior: Behavior normal.        Thought Content: Thought content normal.     No results  found for any visits on 07/10/22.    Assessment & Plan:    Routine Health Maintenance and Physical Exam  Immunization History  Administered Date(s)  Administered   Janssen (J&J) SARS-COV-2 Vaccination 12/06/2019   PFIZER Comirnaty(Gray Top)Covid-19 Tri-Sucrose Vaccine 12/15/2019, 01/05/2020, 09/16/2020   PFIZER(Purple Top)SARS-COV-2 Vaccination 09/21/2020    Health Maintenance  Topic Date Due   Hepatitis C Screening  Never done   COLONOSCOPY (Pts 45-11yrs Insurance coverage will need to be confirmed)  Never done   COVID-19 Vaccine (5 - Pfizer series) 07/26/2022 (Originally 11/16/2020)   INFLUENZA VACCINE  12/23/2022 (Originally 04/24/2022)   TETANUS/TDAP  01/16/2023   HIV Screening  Completed   HPV VACCINES  Aged Out   PAP SMEAR-Modifier  Discontinued   Discussed health benefits of physical activity, and encouraged her to engage in regular exercise appropriate for her age and condition.  Problem List Items Addressed This Visit   None Visit Diagnoses     Preventative health care    -  Primary   Relevant Orders   CBC   Comprehensive metabolic panel   TSH   Lipid panel   Encounter for lipid screening for cardiovascular disease       Relevant Orders   Lipid panel   Screen for STD (sexually transmitted disease)       Relevant Orders   HIV Antibody (routine testing w rflx)   RPR   Urine cytology ancillary only   Hepatitis C antibody   Colon cancer screening       Relevant Orders   Ambulatory referral to Gastroenterology      Return in about 1 year (around 07/11/2023) for CPE (fasting).     Alysia Penna, NP

## 2022-07-11 ENCOUNTER — Other Ambulatory Visit (INDEPENDENT_AMBULATORY_CARE_PROVIDER_SITE_OTHER): Payer: Managed Care, Other (non HMO)

## 2022-07-11 DIAGNOSIS — Z0000 Encounter for general adult medical examination without abnormal findings: Secondary | ICD-10-CM

## 2022-07-11 DIAGNOSIS — Z13220 Encounter for screening for lipoid disorders: Secondary | ICD-10-CM | POA: Diagnosis not present

## 2022-07-11 LAB — LIPID PANEL
Cholesterol: 192 mg/dL (ref 0–200)
HDL: 67.7 mg/dL (ref >39.00)
LDL Cholesterol: 104 mg/dL — ABNORMAL HIGH (ref 0–99)
NonHDL: 124.71
Total CHOL/HDL Ratio: 3
Triglycerides: 106 mg/dL (ref 0.0–149.0)
VLDL: 21.2 mg/dL (ref 0.0–40.0)

## 2022-07-11 LAB — RPR: RPR Ser Ql: NONREACTIVE

## 2022-07-11 LAB — HIV ANTIBODY (ROUTINE TESTING W REFLEX): HIV 1&2 Ab, 4th Generation: NONREACTIVE

## 2022-07-11 LAB — HEPATITIS C ANTIBODY: Hepatitis C Ab: NONREACTIVE

## 2022-07-12 LAB — URINE CYTOLOGY ANCILLARY ONLY
Chlamydia: NEGATIVE
Comment: NEGATIVE
Comment: NEGATIVE
Comment: NORMAL
Neisseria Gonorrhea: NEGATIVE
Trichomonas: NEGATIVE

## 2022-07-13 ENCOUNTER — Other Ambulatory Visit: Payer: Self-pay | Admitting: Nurse Practitioner

## 2022-07-13 DIAGNOSIS — Z1231 Encounter for screening mammogram for malignant neoplasm of breast: Secondary | ICD-10-CM

## 2022-08-24 ENCOUNTER — Encounter: Payer: Self-pay | Admitting: Internal Medicine

## 2022-08-31 ENCOUNTER — Ambulatory Visit
Admission: RE | Admit: 2022-08-31 | Discharge: 2022-08-31 | Disposition: A | Discharge status: Home / Self Care | Payer: Managed Care, Other (non HMO) | Source: Ambulatory Visit

## 2022-08-31 DIAGNOSIS — Z1231 Encounter for screening mammogram for malignant neoplasm of breast: Secondary | ICD-10-CM

## 2022-10-02 ENCOUNTER — Ambulatory Visit (AMBULATORY_SURGERY_CENTER): Payer: Managed Care, Other (non HMO) | Admitting: *Deleted

## 2022-10-02 ENCOUNTER — Telehealth: Payer: Self-pay | Admitting: *Deleted

## 2022-10-02 VITALS — Ht 67.0 in | Wt 175.0 lb

## 2022-10-02 DIAGNOSIS — Z1211 Encounter for screening for malignant neoplasm of colon: Secondary | ICD-10-CM

## 2022-10-02 MED ORDER — NA SULFATE-K SULFATE-MG SULF 17.5-3.13-1.6 GM/177ML PO SOLN: 1.0000 | Freq: Once | ORAL | 0 refills | Status: AC

## 2022-10-02 NOTE — Telephone Encounter (Signed)
sr attempt LM with callback # Will attempt to call again in 5 min 2nd attempt 5 minutes later unable to reach LM  Will attempt 1 more time.

## 2022-10-02 NOTE — Progress Notes (Signed)
No egg or soy allergy known to patient  No issues known to pt with past sedation with any surgeries or procedures Patient denies ever being told they had issues or difficulty with intubation  No FH of Malignant Hyperthermia Pt is not on diet pills Pt is not on  home 02  Pt is not on blood thinners  Pt denies issues with constipation  Pt is not on dialysis Pt denies any upcoming cardiac testing Pt encouraged to use to use Singlecare or Goodrx to reduce cost  Patient's chart reviewed by John Nulty CNRA prior to previsit and patient appropriate for the [facility].  Previsit completed and red dot placed by patient's name on their procedure day (on provider's schedule).  . Visit by phone Instructions sent by mail with coupon   

## 2022-10-17 ENCOUNTER — Encounter: Payer: Self-pay | Admitting: Internal Medicine

## 2022-10-24 ENCOUNTER — Ambulatory Visit: Payer: Managed Care, Other (non HMO) | Admitting: Internal Medicine

## 2022-10-24 ENCOUNTER — Encounter: Payer: Self-pay | Admitting: Internal Medicine

## 2022-10-24 VITALS — BP 142/88 | HR 63 | Temp 97.8°F | Resp 16 | Ht 67.0 in | Wt 175.0 lb

## 2022-10-24 DIAGNOSIS — Z1211 Encounter for screening for malignant neoplasm of colon: Secondary | ICD-10-CM | POA: Diagnosis not present

## 2022-10-24 MED ORDER — SODIUM CHLORIDE 0.9 % IV SOLN: 500.0000 mL | Freq: Once | INTRAVENOUS | Status: DC

## 2022-10-24 NOTE — Progress Notes (Signed)
Pt's states no medical or surgical changes since previsit or office visit. 

## 2022-10-24 NOTE — Progress Notes (Signed)
Report to PACU, RN, vss, BBS= Clear.  

## 2022-10-24 NOTE — Op Note (Signed)
[facility] Patient Name: Yvonne May Procedure Date: 10/24/2022 7:19 AM MRN: [id number] Endoscopist: Georgian Co , , [id number] Age: 47 Referring MD:  Date of Birth: 12/07/1975 Gender: Female Account #: [id number] Procedure:                Colonoscopy Indications:              Screening for colorectal malignant neoplasm, This                            is the patient's first colonoscopy Medicines:                Monitored Anesthesia Care Procedure:                Pre-Anesthesia Assessment:                           - Prior to the procedure, a History and Physical                            was performed, and patient medications and                            allergies were reviewed. The patient's tolerance of                            previous anesthesia was also reviewed. The risks                            and benefits of the procedure and the sedation                            options and risks were discussed with the patient.                            All questions were answered, and informed consent                            was obtained. Prior Anticoagulants: The patient has                            taken no anticoagulant or antiplatelet agents. ASA                            Grade Assessment: II - A patient with mild systemic                            disease. After reviewing the risks and benefits,                            the patient was deemed in satisfactory condition to                            undergo the procedure.  After obtaining informed consent, the colonoscope                            was passed under direct vision. Throughout the                            procedure, the patient's blood pressure, pulse, and                            oxygen saturations were monitored continuously. The                            CF [id number] #[id number] was introduced through the anus                            and advanced  to the the terminal ileum. The                            colonoscopy was performed without difficulty. The                            patient tolerated the procedure well. The quality                            of the bowel preparation was good. The terminal                            ileum, ileocecal valve, appendiceal orifice, and                            rectum were photographed. Scope In: 8:26:40 AM Scope Out: 8:39:02 AM Scope Withdrawal Time: 0 hours 7 minutes 30 seconds  Total Procedure Duration: 0 hours 12 minutes 22 seconds  Findings:                 The terminal ileum appeared normal.                           Non-bleeding internal hemorrhoids were found during                            retroflexion.                           The exam was otherwise without abnormality. Complications:            No immediate complications. Estimated Blood Loss:     Estimated blood loss was minimal. Impression:               - The examined portion of the ileum was normal.                           - Non-bleeding internal hemorrhoids.                           - The examination was otherwise normal.                           -  No specimens collected. Recommendation:           - Discharge patient to home (with escort).                           - Repeat colonoscopy in 10 years for screening                            purposes.                           - The findings and recommendations were discussed                            with the patient. Dr Georgian Co "[place]" Yvonne May,  10/24/2022 8:41:01 AM

## 2022-10-24 NOTE — Progress Notes (Signed)
GASTROENTEROLOGY PROCEDURE H&P NOTE   Primary Care Physician: Flossie Buffy, NP    Reason for Procedure:   Colon cancer screening  Plan:    Colonoscopy  Patient is appropriate for endoscopic procedure(s) in the ambulatory ([place]) setting.  The nature of the procedure, as well as the risks, benefits, and alternatives were carefully and thoroughly reviewed with the patient. Ample time for discussion and questions allowed. The patient understood, was satisfied, and agreed to proceed.     HPI: Yvonne May is a 47 y.o. female who presents for colonoscopy for colon cancer screening. Denies blood in stools, changes in bowel habits, or unintentional weight loss. Denies family history of colon cancer.  Past Medical History:  Diagnosis Date   Hypertension     Past Surgical History:  Procedure Laterality Date   ABDOMINAL HYSTERECTOMY     BREAST BIOPSY Right 04/29/2019   Fibroadenoma   BREAST BIOPSY Left     Prior to Admission medications   Medication Sig Start Date End Date Taking? Authorizing Provider  Ascorbic Acid (VITAMIN C) 1000 MG tablet Take 1,000 mg by mouth daily.   Yes [provider]  cholecalciferol (VITAMIN D3) 25 MCG (1000 UT) tablet Take 1,000 Units by mouth daily.   Yes [provider]  Multiple Vitamin (MULTIVITAMIN) capsule Take 1 capsule by mouth daily.   Yes [provider]    Current Outpatient Medications  Medication Sig Dispense Refill   Ascorbic Acid (VITAMIN C) 1000 MG tablet Take 1,000 mg by mouth daily.     cholecalciferol (VITAMIN D3) 25 MCG (1000 UT) tablet Take 1,000 Units by mouth daily.     Multiple Vitamin (MULTIVITAMIN) capsule Take 1 capsule by mouth daily.     Current Facility-Administered Medications  Medication Dose Route Frequency Provider Last Rate Last Admin   0.9 %  sodium chloride infusion  500 mL Intravenous Once Sharyn Creamer, MD        Allergies as of 10/24/2022 - Review Complete  10/24/2022  Allergen Reaction Noted   Penicillins  11/21/2018    Family History  Problem Relation Age of Onset   Breast cancer Mother 38   Diabetes Mother    Hypertension Mother    Hypertension Father    Diabetes Father    Diabetes Brother    Colon cancer Neg Hx    Colon polyps Neg Hx    Esophageal cancer Neg Hx    Rectal cancer Neg Hx    Stomach cancer Neg Hx     Social History   Socioeconomic History   Marital status: Single    Spouse name: Not on file   Number of children: Not on file   Years of education: Not on file   Highest education level: Not on file  Occupational History   Not on file  Tobacco Use   Smoking status: Never   Smokeless tobacco: Never  Vaping Use   Vaping Use: Never used  Substance and Sexual Activity   Alcohol use: Never   Drug use: Never   Sexual activity: Yes    Partners: Male    Birth control/protection: None  Other Topics Concern   Not on file  Social History Narrative   Not on file   Social Determinants of Health   Financial Resource Strain: Not on file  Food Insecurity: Not on file  Transportation Needs: Not on file  Physical Activity: Not on file  Stress: Not on file  Social Connections: Not on file  Intimate Partner Violence: Not on file    Physical Exam: Vital signs in last 24 hours: BP 116/77   Pulse 70   Temp 97.8 F (36.6 C)   Ht 5\' 7"  (1.702 m)   Wt 175 lb (79.4 kg)   SpO2 100%   BMI 27.41 kg/m  GEN: NAD EYE: Sclerae anicteric ENT: MMM CV: Non-tachycardic Pulm: No increased work of breathing GI: Soft, NT/ND NEURO:  Alert & Oriented   Christia Reading, MD [facility]  10/24/2022 8:01 AM

## 2022-10-24 NOTE — Patient Instructions (Addendum)
The examined portion of the ileum was normal. Non-bleeding internal hemorrhoids. Please read handout The examination was otherwise normal. No specimens collected. Discharge patient to home (with escort). Repeat colonoscopy in 10 years for screening purposes.   YOU HAD AN ENDOSCOPIC PROCEDURE TODAY AT [facility]:   Refer to the procedure report that was given to you for any specific questions about what was found during the examination.  If the procedure report does not answer your questions, please call your gastroenterologist to clarify.  If you requested that your care partner not be given the details of your procedure findings, then the procedure report has been included in a sealed envelope for you to review at your convenience later.  YOU SHOULD EXPECT: Some feelings of bloating in the abdomen. Passage of more gas than usual.  Walking can help get rid of the air that was put into your GI tract during the procedure and reduce the bloating. If you had a lower endoscopy (such as a colonoscopy or flexible sigmoidoscopy) you may notice spotting of blood in your stool or on the toilet paper. If you underwent a bowel prep for your procedure, you may not have a normal bowel movement for a few days.  Please Note:  You might notice some irritation and congestion in your nose or some drainage.  This is from the oxygen used during your procedure.  There is no need for concern and it should clear up in a day or so.  SYMPTOMS TO REPORT IMMEDIATELY:  Following lower endoscopy (colonoscopy or flexible sigmoidoscopy):  Excessive amounts of blood in the stool  Significant tenderness or worsening of abdominal pains  Swelling of the abdomen that is new, acute  Fever of 100F or higher  For urgent or emergent issues, a gastroenterologist can be reached at any hour by calling [phone]. Do not use MyChart messaging for urgent concerns.    DIET:  We do recommend a small meal at first,  but then you may proceed to your regular diet.  Drink plenty of fluids but you should avoid alcoholic beverages for 24 hours.  ACTIVITY:  You should plan to take it easy for the rest of today and you should NOT DRIVE or use heavy machinery until tomorrow (because of the sedation medicines used during the test).    FOLLOW UP: Our staff will call the number listed on your records the next business day following your procedure.  We will call around 7:15- 8:00 am to check on you and address any questions or concerns that you may have regarding the information given to you following your procedure. If we do not reach you, we will leave a message.        SIGNATURES/CONFIDENTIALITY: You and/or your care partner have signed paperwork which will be entered into your electronic medical record.  These signatures attest to the fact that that the information above on your After Visit Summary has been reviewed and is understood.  Full responsibility of the confidentiality of this discharge information lies with you and/or your care-partner.

## 2022-10-25 ENCOUNTER — Telehealth: Payer: Self-pay

## 2022-10-25 NOTE — Telephone Encounter (Signed)
  Follow up Call-     10/24/2022    7:43 AM  Call back number  Post procedure Call Back phone  # [phone]  Permission to leave phone message Yes     Patient questions:  Do you have a fever, pain , or abdominal swelling? No. Pain Score  0 *  Have you tolerated food without any problems? Yes.    Have you been able to return to your normal activities? Yes.    Do you have any questions about your discharge instructions: Diet   No. Medications  No. Follow up visit  No.  Do you have questions or concerns about your Care? No.  Actions: * If pain score is 4 or above: No action needed, pain <4.

## 2023-07-12 ENCOUNTER — Encounter: Payer: Managed Care, Other (non HMO) | Admitting: Nurse Practitioner

## 2023-10-08 ENCOUNTER — Encounter: Payer: Self-pay | Admitting: Nurse Practitioner

## 2023-10-08 ENCOUNTER — Ambulatory Visit: Payer: Managed Care, Other (non HMO) | Admitting: Nurse Practitioner

## 2023-10-08 ENCOUNTER — Other Ambulatory Visit (HOSPITAL_COMMUNITY)
Admission: RE | Admit: 2023-10-08 | Discharge: 2023-10-08 | Disposition: A | Discharge status: Home / Self Care | Payer: Managed Care, Other (non HMO) | Source: Ambulatory Visit | Attending: Nurse Practitioner | Admitting: Nurse Practitioner

## 2023-10-08 VITALS — BP 132/80 | HR 68 | Temp 98.0°F | Resp 18 | Ht 65.0 in | Wt 189.4 lb

## 2023-10-08 DIAGNOSIS — Z124 Encounter for screening for malignant neoplasm of cervix: Secondary | ICD-10-CM

## 2023-10-08 DIAGNOSIS — R030 Elevated blood-pressure reading, without diagnosis of hypertension: Secondary | ICD-10-CM

## 2023-10-08 DIAGNOSIS — Z13220 Encounter for screening for lipoid disorders: Secondary | ICD-10-CM

## 2023-10-08 DIAGNOSIS — Z90711 Acquired absence of uterus with remaining cervical stump: Secondary | ICD-10-CM

## 2023-10-08 DIAGNOSIS — I10 Essential (primary) hypertension: Secondary | ICD-10-CM | POA: Insufficient documentation

## 2023-10-08 DIAGNOSIS — Z0001 Encounter for general adult medical examination with abnormal findings: Secondary | ICD-10-CM | POA: Diagnosis not present

## 2023-10-08 DIAGNOSIS — Z23 Encounter for immunization: Secondary | ICD-10-CM | POA: Diagnosis not present

## 2023-10-08 LAB — LIPID PANEL
Cholesterol: 213 mg/dL — ABNORMAL HIGH (ref 0–200)
HDL: 72.6 mg/dL
LDL Cholesterol: 129 mg/dL — ABNORMAL HIGH (ref 0–99)
NonHDL: 140.74
Total CHOL/HDL Ratio: 3
Triglycerides: 58 mg/dL (ref 0.0–149.0)
VLDL: 11.6 mg/dL (ref 0.0–40.0)

## 2023-10-08 LAB — COMPREHENSIVE METABOLIC PANEL WITH GFR
ALT: 13 U/L (ref 0–35)
AST: 20 U/L (ref 0–37)
Albumin: 4.4 g/dL (ref 3.5–5.2)
Alkaline Phosphatase: 59 U/L (ref 39–117)
BUN: 9 mg/dL (ref 6–23)
CO2: 27 meq/L (ref 19–32)
Calcium: 9.6 mg/dL (ref 8.4–10.5)
Chloride: 103 meq/L (ref 96–112)
Creatinine, Ser: 0.85 mg/dL (ref 0.40–1.20)
GFR: 81.12 mL/min
Glucose, Bld: 78 mg/dL (ref 70–99)
Potassium: 3.9 meq/L (ref 3.5–5.1)
Sodium: 138 meq/L (ref 135–145)
Total Bilirubin: 0.5 mg/dL (ref 0.2–1.2)
Total Protein: 7.8 g/dL (ref 6.0–8.3)

## 2023-10-08 LAB — CBC
HCT: 40.5 % (ref 36.0–46.0)
Hemoglobin: 12.8 g/dL (ref 12.0–15.0)
MCHC: 31.5 g/dL (ref 30.0–36.0)
MCV: 83 fl (ref 78.0–100.0)
Platelets: 227 K/uL (ref 150.0–400.0)
RBC: 4.88 Mil/uL (ref 3.87–5.11)
RDW: 14.4 % (ref 11.5–15.5)
WBC: 4.4 K/uL (ref 4.0–10.5)

## 2023-10-08 LAB — TSH: TSH: 0.98 u[IU]/mL (ref 0.35–5.50)

## 2023-10-08 NOTE — Assessment & Plan Note (Signed)
 Repeat PAP today Ok to discontinue cervical cancer screen if this PAP smear is normal

## 2023-10-08 NOTE — Progress Notes (Signed)
 Complete physical exam  Patient: Yvonne May   DOB: 22-Jan-1976   48 y.o. Female  MRN: [id number] Visit Date: 10/08/2023  Subjective:    Chief Complaint  Patient presents with   Annual Exam    Cervical screening and TDAP vaccine are due.    Yvonne May is a 48 y.o. female who presents today for a complete physical exam. She reports consuming a general diet.  No exercise at home  She generally feels fairly well. She reports sleeping poorly. She does have additional problems to discuss today.  Vision:No Dental:No STD Screen:No  BP Readings from Last 3 Encounters:  10/08/23 132/80  10/24/22 (!) 142/88  07/10/22 124/82   Wt Readings from Last 3 Encounters:  10/08/23 189 lb 6.4 oz (85.9 kg)  10/24/22 175 lb (79.4 kg)  10/02/22 175 lb (79.4 kg)   Most recent fall risk assessment:    10/08/2023    8:53 AM  Fall Risk   Falls in the past year? 0  Number falls in past yr: 0  Injury with Fall? 0  Risk for fall due to : No Fall Risks  Follow up Falls evaluation completed   Depression screen:Yes - No Depression Most recent depression screenings:    10/08/2023    8:58 AM 07/10/2022    1:31 PM  PHQ 2/9 Scores  PHQ - 2 Score 1 0  PHQ- 9 Score 5 0    HPI  S/P partial hysterectomy Repeat PAP today Ok to discontinue cervical cancer screen if this PAP smear is normal  Elevated BP without diagnosis of hypertension Advised to maintain DASH diet and daily exercise. BP Readings from Last 3 Encounters:  10/08/23 132/80  10/24/22 (!) 142/88  07/10/22 124/82    F/up in 25month  Past Medical History:  Diagnosis Date   Hypertension    Past Surgical History:  Procedure Laterality Date   ABDOMINAL HYSTERECTOMY     BREAST BIOPSY Right 04/29/2019   Fibroadenoma   BREAST BIOPSY Left    Social History   Socioeconomic History   Marital status: Single    Spouse name: Not on file   Number of children: Not on file   Years of education: Not on file   Highest  education level: Not on file  Occupational History   Not on file  Tobacco Use   Smoking status: Never   Smokeless tobacco: Never  Vaping Use   Vaping status: Never Used  Substance and Sexual Activity   Alcohol use: Never   Drug use: Never   Sexual activity: Yes    Partners: Male    Birth control/protection: None  Other Topics Concern   Not on file  Social History Narrative   Not on file   Social Drivers of Health   Financial Resource Strain: Not on file  Food Insecurity: Low Risk  (07/18/2023)   Received from [facility]   Hunger Vital Sign    Worried About Running Out of Food in the Last Year: Never true    Ran Out of Food in the Last Year: Never true  Transportation Needs: No Transportation Needs (07/18/2023)   Received from [facility]    In the past 12 months, has lack of reliable transportation kept you from medical appointments, meetings, work or from getting things needed for daily living? : No  Physical Activity: Not on file  Stress: Not on file  Social Connections: Not on file  Intimate Partner Violence: Not on  file   Family Status  Relation Name Status   Mother  Alive   Father  Alive   Sister  Alive   Brother  Alive   Neg Hx  (Not Specified)  No partnership data on file   Family History  Problem Relation Age of Onset   Breast cancer Mother 31   Diabetes Mother    Hypertension Mother    Hypertension Father    Diabetes Father    Diabetes Brother    Colon cancer Neg Hx    Colon polyps Neg Hx    Esophageal cancer Neg Hx    Rectal cancer Neg Hx    Stomach cancer Neg Hx    Allergies  Allergen Reactions   Penicillins     Since childhood    Patient Care Team: Rielle Schlauch, Roselie Rockford, NP as PCP - General (Internal Medicine)   Medications: Outpatient Medications Prior to Visit  Medication Sig   Ascorbic Acid (VITAMIN C) 1000 MG tablet Take 1,000 mg by mouth daily.   cholecalciferol (VITAMIN D3) 25 MCG (1000 UT) tablet Take 1,000  Units by mouth daily.   Multiple Vitamin (MULTIVITAMIN) capsule Take 1 capsule by mouth daily.   No facility-administered medications prior to visit.    Review of Systems  Constitutional:  Negative for activity change, appetite change and unexpected weight change.  Respiratory: Negative.    Cardiovascular: Negative.   Gastrointestinal: Negative.   Endocrine: Negative for cold intolerance and heat intolerance.  Genitourinary: Negative.   Musculoskeletal: Negative.   Skin: Negative.   Neurological: Negative.   Hematological: Negative.   Psychiatric/Behavioral:  Negative for behavioral problems, decreased concentration, dysphoric mood, hallucinations, self-injury, sleep disturbance and suicidal ideas. The patient is not nervous/anxious.         Objective:  BP 132/80 (BP Location: Left Arm, Patient Position: Supine, Cuff Size: Normal)   Pulse 68   Temp 98 F (36.7 C) (Temporal)   Resp 18   Ht 5' 5 (1.651 m)   Wt 189 lb 6.4 oz (85.9 kg)   SpO2 100%   BMI 31.52 kg/m     Physical Exam Vitals and nursing note reviewed. Exam conducted with a chaperone present.  Constitutional:      General: She is not in acute distress. HENT:     Right Ear: Tympanic membrane, ear canal and external ear normal.     Left Ear: Tympanic membrane, ear canal and external ear normal.     Nose: Nose normal.  Eyes:     Extraocular Movements: Extraocular movements intact.     Conjunctiva/sclera: Conjunctivae normal.     Pupils: Pupils are equal, round, and reactive to light.  Neck:     Thyroid : No thyroid  mass, thyromegaly or thyroid  tenderness.  Cardiovascular:     Rate and Rhythm: Normal rate and regular rhythm.     Pulses: Normal pulses.     Heart sounds: Normal heart sounds.  Pulmonary:     Effort: Pulmonary effort is normal.     Breath sounds: Normal breath sounds.  Chest:  Breasts:    Breasts are symmetrical.     Right: Normal.     Left: Normal.  Abdominal:     General: Bowel sounds  are normal.     Palpations: Abdomen is soft.     Hernia: There is no hernia in the left inguinal area or right inguinal area.  Genitourinary:    General: Normal vulva.     Exam position: Lithotomy position.  Labia:        Right: No rash, tenderness or lesion.        Left: No rash, tenderness or lesion.      Urethra: No prolapse or urethral pain.     Vagina: Normal.     Adnexa: Right adnexa normal and left adnexa normal.     Comments: Uterus and cervix surgically removed Musculoskeletal:        General: Normal range of motion.     Cervical back: Normal range of motion and neck supple.     Right lower leg: No edema.     Left lower leg: No edema.  Lymphadenopathy:     Cervical: No cervical adenopathy.     Upper Body:     Right upper body: No supraclavicular, axillary or pectoral adenopathy.     Left upper body: No supraclavicular, axillary or pectoral adenopathy.     Lower Body: No right inguinal adenopathy. No left inguinal adenopathy.  Skin:    General: Skin is warm and dry.  Neurological:     Mental Status: She is alert and oriented to person, place, and time.     Cranial Nerves: No cranial nerve deficit.  Psychiatric:        Mood and Affect: Mood normal.        Behavior: Behavior normal.        Thought Content: Thought content normal.     No results found for any visits on 10/08/23.    Assessment & Plan:    Routine Health Maintenance and Physical Exam  Immunization History  Administered Date(s) Administered   Janssen (J&J) SARS-COV-2 Vaccination 12/06/2019   PFIZER Comirnaty(Gray Top)Covid-19 Tri-Sucrose Vaccine 01/05/2020, 09/16/2020   PFIZER(Purple Top)SARS-COV-2 Vaccination 09/21/2020    Health Maintenance  Topic Date Due   DTaP/Tdap/Td (1 - Tdap) Never done   Cervical Cancer Screening (HPV/Pap Cotest)  11/24/2021   INFLUENZA VACCINE  12/23/2023 (Originally 04/25/2023)   COVID-19 Vaccine (6 - 2024-25 season) 12/23/2023 (Originally 05/26/2023)   Colonoscopy   10/24/2032   Hepatitis C Screening  Completed   HIV Screening  Completed   HPV VACCINES  Aged Out   Discussed health benefits of physical activity, and encouraged her to engage in regular exercise appropriate for her age and condition.  Problem List Items Addressed This Visit     Elevated BP without diagnosis of hypertension   Advised to maintain DASH diet and daily exercise. BP Readings from Last 3 Encounters:  10/08/23 132/80  10/24/22 (!) 142/88  07/10/22 124/82    F/up in 42month       S/P partial hysterectomy   Repeat PAP today Ok to discontinue cervical cancer screen if this PAP smear is normal      Other Visit Diagnoses       Encounter for preventative adult health care exam with abnormal findings    -  Primary   Relevant Orders   Comprehensive metabolic panel   Lipid panel   CBC   TSH     Immunization due       Relevant Orders   Tdap vaccine greater than or equal to 7yo IM     Encounter for Papanicolaou smear for cervical cancer screening       Relevant Orders   Cytology - PAP      Return in about 4 weeks (around 11/05/2023) for HTN.     Roselie Mood, NP

## 2023-10-08 NOTE — Patient Instructions (Signed)
 Go to lab Maintain Heart healthy diet and daily exercise. Schedule appointment for mammogram and eye exam Monitor Bp daily in AM Bring BP readings to next appt  DASH Eating Plan DASH stands for Dietary Approaches to Stop Hypertension. The DASH eating plan is a healthy eating plan that has been shown to: Lower high blood pressure (hypertension). Reduce your risk for type 2 diabetes, heart disease, and stroke. Help with weight loss. What are tips for following this plan? Reading food labels Check food labels for the amount of salt (sodium) per serving. Choose foods with less than 5 percent of the Daily Value (DV) of sodium. In general, foods with less than 300 milligrams (mg) of sodium per serving fit into this eating plan. To find whole grains, look for the word whole as the first word in the ingredient list. Shopping Buy products labeled as low-sodium or no salt added. Buy fresh foods. Avoid canned foods and pre-made or frozen meals. Cooking Try not to add salt when you cook. Use salt-free seasonings or herbs instead of table salt or sea salt. Check with your health care provider or pharmacist before using salt substitutes. Do not fry foods. Cook foods in healthy ways, such as baking, boiling, grilling, roasting, or broiling. Cook using oils that are good for your heart. These include olive, canola, avocado, soybean, and sunflower oil. Meal planning  Eat a balanced diet. This should include: 4 or more servings of fruits and 4 or more servings of vegetables each day. Try to fill half of your plate with fruits and vegetables. 6-8 servings of whole grains each day. 6 or less servings of lean meat, poultry, or fish each day. 1 oz is 1 serving. A 3 oz (85 g) serving of meat is about the same size as the palm of your hand. One egg is 1 oz (28 g). 2-3 servings of low-fat dairy each day. One serving is 1 cup (237 mL). 1 serving of nuts, seeds, or beans 5 times each week. 2-3 servings of  heart-healthy fats. Healthy fats called omega-3 fatty acids are found in foods such as walnuts, flaxseeds, fortified milks, and eggs. These fats are also found in cold-water fish, such as sardines, salmon, and mackerel. Limit how much you eat of: Canned or prepackaged foods. Food that is high in trans fat, such as fried foods. Food that is high in saturated fat, such as fatty meat. Desserts and other sweets, sugary drinks, and other foods with added sugar. Full-fat dairy products. Do not salt foods before eating. Do not eat more than 4 egg yolks a week. Try to eat at least 2 vegetarian meals a week. Eat more home-cooked food and less restaurant, buffet, and fast food. Lifestyle When eating at a restaurant, ask if your food can be made with less salt or no salt. If you drink alcohol: Limit how much you have to: 0-1 drink a day if you are female. 0-2 drinks a day if you are female. Know how much alcohol is in your drink. In the [place] one drink is one 12 oz bottle of beer (355 mL), one 5 oz glass of wine (148 mL), or one 1 oz glass of hard liquor (44 mL). General information Avoid eating more than 2,300 mg of salt a day. If you have hypertension, you may need to reduce your sodium intake to 1,500 mg a day. Work with your provider to stay at a healthy body weight or lose weight. Ask what the best weight range  is for you. On most days of the week, get at least 30 minutes of exercise that causes your heart to beat faster. This may include walking, swimming, or biking. Work with your provider or dietitian to adjust your eating plan to meet your specific calorie needs. What foods should I eat? Fruits All fresh, dried, or frozen fruit. Canned fruits that are in their natural juice and do not have sugar added to them. Vegetables Fresh or frozen vegetables that are raw, steamed, roasted, or grilled. Low-sodium or reduced-sodium tomato and vegetable juice. Low-sodium or reduced-sodium tomato sauce and  tomato paste. Low-sodium or reduced-sodium canned vegetables. Grains Whole-grain or whole-wheat bread. Whole-grain or whole-wheat pasta. Brown rice. Mcneil Madeira. Bulgur. Whole-grain and low-sodium cereals. Pita bread. Low-fat, low-sodium crackers. Whole-wheat flour tortillas. Meats and other proteins Skinless chicken or turkey. Ground chicken or turkey. Pork with fat trimmed off. Fish and seafood. Egg whites. Dried beans, peas, or lentils. Unsalted nuts, nut butters, and seeds. Unsalted canned beans. Lean cuts of beef with fat trimmed off. Low-sodium, lean precooked or cured meat, such as sausages or meat loaves. Dairy Low-fat (1%) or fat-free (skim) milk. Reduced-fat, low-fat, or fat-free cheeses. Nonfat, low-sodium ricotta or cottage cheese. Low-fat or nonfat yogurt. Low-fat, low-sodium cheese. Fats and oils Soft margarine without trans fats. Vegetable oil. Reduced-fat, low-fat, or light mayonnaise and salad dressings (reduced-sodium). Canola, safflower, olive, avocado, soybean, and sunflower oils. Avocado. Seasonings and condiments Herbs. Spices. Seasoning mixes without salt. Other foods Unsalted popcorn and pretzels. Fat-free sweets. The items listed above may not be all the foods and drinks you can have. Talk to a dietitian to learn more. What foods should I avoid? Fruits Canned fruit in a light or heavy syrup. Fried fruit. Fruit in cream or butter sauce. Vegetables Creamed or fried vegetables. Vegetables in a cheese sauce. Regular canned vegetables that are not marked as low-sodium or reduced-sodium. Regular canned tomato sauce and paste that are not marked as low-sodium or reduced-sodium. Regular tomato and vegetable juices that are not marked as low-sodium or reduced-sodium. Dene. Olives. Grains Baked goods made with fat, such as croissants, muffins, or some breads. Dry pasta or rice meal packs. Meats and other proteins Fatty cuts of meat. Ribs. Fried meat. Aldona. Bologna,  salami, and other precooked or cured meats, such as sausages or meat loaves, that are not lean and low in sodium. Fat from the back of a pig (fatback). Bratwurst. Salted nuts and seeds. Canned beans with added salt. Canned or smoked fish. Whole eggs or egg yolks. Chicken or turkey with skin. Dairy Whole or 2% milk, cream, and half-and-half. Whole or full-fat cream cheese. Whole-fat or sweetened yogurt. Full-fat cheese. Nondairy creamers. Whipped toppings. Processed cheese and cheese spreads. Fats and oils Butter. Stick margarine. Lard. Shortening. Ghee. Bacon fat. Tropical oils, such as coconut, palm kernel, or palm oil. Seasonings and condiments Onion salt, garlic salt, seasoned salt, table salt, and sea salt. Worcestershire sauce. Tartar sauce. Barbecue sauce. Teriyaki sauce. Soy sauce, including reduced-sodium soy sauce. Steak sauce. Canned and packaged gravies. Fish sauce. Oyster sauce. Cocktail sauce. Store-bought horseradish. Ketchup. Mustard. Meat flavorings and tenderizers. Bouillon cubes. Hot sauces. Pre-made or packaged marinades. Pre-made or packaged taco seasonings. Relishes. Regular salad dressings. Other foods Salted popcorn and pretzels. The items listed above may not be all the foods and drinks you should avoid. Talk to a dietitian to learn more. Where to find more information National Heart, Lung, and Blood Institute (NHLBI): buffalodrycleaner.gl American Heart Association (AHA): heart.org  Academy of Nutrition and Dietetics: eatright.org National Kidney Foundation (NKF): kidney.org This information is not intended to replace advice given to you by your health care provider. Make sure you discuss any questions you have with your health care provider. Document Revised: 09/27/2022 Document Reviewed: 09/27/2022 Elsevier Patient Education  2024 [employer].

## 2023-10-08 NOTE — Assessment & Plan Note (Signed)
 Advised to maintain DASH diet and daily exercise. BP Readings from Last 3 Encounters:  10/08/23 132/80  10/24/22 (!) 142/88  07/10/22 124/82    F/up in 60month

## 2023-10-09 LAB — CYTOLOGY - PAP
Comment: NEGATIVE
Diagnosis: NEGATIVE
High risk HPV: NEGATIVE

## 2023-10-16 ENCOUNTER — Telehealth: Payer: Self-pay | Admitting: Nurse Practitioner

## 2023-10-16 NOTE — Telephone Encounter (Signed)
Patient is calling in regards to her bp   bottom number being high she would like a call back

## 2023-10-16 NOTE — Telephone Encounter (Signed)
Home BP Readings: 139/92 10/16/23 @1000   ~147/94 10/14/22  Pt is concerned about the increase in her DBP.  Pt states she is trying to check her BP in the morning before work, she is staying away from salty and fried foods, chips, eating fruits and veggies, etc. I educated on how to properly take BP at home, the difference that taking in the morning versus after work can make with readings, S/S to watch for and report. I will upload a few resources for pt to her MyChart. Advised to call for an appointment if steady increase in either SBP or DBP noticed and/or if start to experience symptoms. Advised that we can check a manual alongside her hom cuff to check accuracy. Advised pt of calling office telephone number after hours to reach the on call nurse. Pt expressed understanding.

## 2023-10-21 ENCOUNTER — Ambulatory Visit (INDEPENDENT_AMBULATORY_CARE_PROVIDER_SITE_OTHER): Payer: Managed Care, Other (non HMO) | Admitting: Nurse Practitioner

## 2023-10-21 ENCOUNTER — Encounter: Payer: Self-pay | Admitting: Nurse Practitioner

## 2023-10-21 VITALS — BP 120/80 | HR 77 | Temp 98.5°F | Resp 18 | Wt 187.0 lb

## 2023-10-21 DIAGNOSIS — I10 Essential (primary) hypertension: Secondary | ICD-10-CM

## 2023-10-21 DIAGNOSIS — G44229 Chronic tension-type headache, not intractable: Secondary | ICD-10-CM | POA: Insufficient documentation

## 2023-10-21 MED ORDER — TRIAMTERENE-HCTZ 37.5-25 MG PO TABS: 0.5000 | ORAL_TABLET | Freq: Every day | ORAL | 5 refills | Status: DC

## 2023-10-21 NOTE — Assessment & Plan Note (Signed)
Chronic, intermittent, trigger-stress 1episode every 2months, last about 1-2days, relieved with ASA 324mg .  No improvement with tylenol or advil or excedrin Describes as frontal, temporal and occipital pressure, associated with light and sound sensitivity No aura Frequency not affected by elevated BP. Has noticed elevated BP with each episode-170s/100s

## 2023-10-21 NOTE — Progress Notes (Signed)
Established Patient Visit  Patient: Yvonne May   DOB: 04/22/1976   48 y.o. Female  MRN: [id number] Visit Date: 10/21/2023  Subjective:    Chief Complaint  Patient presents with   Hypertension    1 month follow up; PT has BP machine and log present    HTN (hypertension) Home machine today: 136/95 L. Arm sitting Home BP readings: 132/80, 147/94, 149/97, 143/93, 136/89, 145/91, 147/93, 147/91. Has maintained DASH diet. No HA, no dizziness, no palpitations  BP Readings from Last 3 Encounters:  10/21/23 120/80  10/08/23 132/80  10/24/22 (!) 142/88    Start Maxzide 0.5tab in AM Maintain DASH diet Send BP reading via mychart in 2-4weeks Repeat BMP in 77month F/up in 3months  Tension headache, chronic Chronic, intermittent, trigger-stress 1episode every 2months, last about 1-2days, relieved with ASA 324mg .  No improvement with tylenol or advil or excedrin Describes as frontal, temporal and occipital pressure, associated with light and sound sensitivity No aura Frequency not affected by elevated BP. Has noticed elevated BP with each episode-170s/100s   Reviewed medical, surgical, and social history today  Medications: Outpatient Medications Prior to Visit  Medication Sig   Ascorbic Acid (VITAMIN C) 1000 MG tablet Take 1,000 mg by mouth daily.   cholecalciferol (VITAMIN D3) 25 MCG (1000 UT) tablet Take 1,000 Units by mouth daily.   Multiple Vitamin (MULTIVITAMIN) capsule Take 1 capsule by mouth daily.   No facility-administered medications prior to visit.   Reviewed past medical and social history.   ROS per HPI above      Objective:  BP 120/80 (BP Location: Left Arm, Patient Position: Sitting, Cuff Size: Normal)   Pulse 77   Temp 98.5 F (36.9 C) (Temporal)   Resp 18   Wt 187 lb (84.8 kg)   SpO2 100%   BMI 31.12 kg/m      Physical Exam Vitals and nursing note reviewed.  Cardiovascular:     Rate and Rhythm: Normal rate.     Pulses:  Normal pulses.  Pulmonary:     Effort: Pulmonary effort is normal.  Musculoskeletal:     Right lower leg: No edema.     Left lower leg: No edema.  Neurological:     Mental Status: She is alert and oriented to person, place, and time.     No results found for any visits on 10/21/23.    Assessment & Plan:    Problem List Items Addressed This Visit     HTN (hypertension) - Primary   Home machine today: 136/95 L. Arm sitting Home BP readings: 132/80, 147/94, 149/97, 143/93, 136/89, 145/91, 147/93, 147/91. Has maintained DASH diet. No HA, no dizziness, no palpitations  BP Readings from Last 3 Encounters:  10/21/23 120/80  10/08/23 132/80  10/24/22 (!) 142/88    Start Maxzide 0.5tab in AM Maintain DASH diet Send BP reading via mychart in 2-4weeks Repeat BMP in 77month F/up in 3months      Relevant Medications   triamterene-hydrochlorothiazide (MAXZIDE-25) 37.5-25 MG tablet   Other Relevant Orders   Renal Function Panel   Tension headache, chronic   Chronic, intermittent, trigger-stress 1episode every 2months, last about 1-2days, relieved with ASA 324mg .  No improvement with tylenol or advil or excedrin Describes as frontal, temporal and occipital pressure, associated with light and sound sensitivity No aura Frequency not affected by elevated BP. Has noticed elevated BP with each episode-170s/100s  Return in about 3 months (around 01/19/2024) for HTN.     Alysia Penna, NP

## 2023-10-21 NOTE — Assessment & Plan Note (Signed)
Home machine today: 136/95 L. Arm sitting Home BP readings: 132/80, 147/94, 149/97, 143/93, 136/89, 145/91, 147/93, 147/91. Has maintained DASH diet. No HA, no dizziness, no palpitations  BP Readings from Last 3 Encounters:  10/21/23 120/80  10/08/23 132/80  10/24/22 (!) 142/88    Start Maxzide 0.5tab in AM Maintain DASH diet Send BP reading via mychart in 2-4weeks Repeat BMP in 61month F/up in 3months

## 2023-10-21 NOTE — Patient Instructions (Signed)
Schedule lab appointment in . Send BP readings via mychart in 2weeks. Maintain Heart healthy diet and daily exercise.

## 2023-11-04 ENCOUNTER — Encounter: Payer: Self-pay | Admitting: Nurse Practitioner

## 2023-11-06 ENCOUNTER — Ambulatory Visit: Payer: Managed Care, Other (non HMO) | Admitting: Nurse Practitioner

## 2023-11-12 ENCOUNTER — Ambulatory Visit: Payer: Managed Care, Other (non HMO) | Admitting: Nurse Practitioner

## 2023-11-25 ENCOUNTER — Ambulatory Visit (INDEPENDENT_AMBULATORY_CARE_PROVIDER_SITE_OTHER): Payer: Managed Care, Other (non HMO) | Admitting: Nurse Practitioner

## 2023-11-25 ENCOUNTER — Encounter: Payer: Self-pay | Admitting: Nurse Practitioner

## 2023-11-25 DIAGNOSIS — I10 Essential (primary) hypertension: Secondary | ICD-10-CM

## 2023-11-25 LAB — RENAL FUNCTION PANEL
Albumin: 4.3 g/dL (ref 3.5–5.2)
BUN: 10 mg/dL (ref 6–23)
CO2: 30 meq/L (ref 19–32)
Calcium: 9.6 mg/dL (ref 8.4–10.5)
Chloride: 102 meq/L (ref 96–112)
Creatinine, Ser: 0.84 mg/dL (ref 0.40–1.20)
GFR: 82.2 mL/min (ref >60.00)
Glucose, Bld: 88 mg/dL (ref 70–99)
Phosphorus: 4 mg/dL (ref 2.3–4.6)
Potassium: 3.5 meq/L (ref 3.5–5.1)
Sodium: 140 meq/L (ref 135–145)

## 2023-11-25 NOTE — Progress Notes (Unsigned)
                Established Patient Visit  Patient: Yvonne May   DOB: 1975-11-07   48 y.o. Female  MRN: [id number] Visit Date: 11/26/2023  Subjective:    Chief Complaint  Patient presents with  . Follow-up    1 month f/u  . Error   HPI No problem-specific Assessment & Plan notes found for this encounter.   Reviewed medical, surgical, and social history today  Medications: Outpatient Medications Prior to Visit  Medication Sig  . Ascorbic Acid (VITAMIN C) 1000 MG tablet Take 1,000 mg by mouth daily.  . cholecalciferol (VITAMIN D3) 25 MCG (1000 UT) tablet Take 1,000 Units by mouth daily.  . Multiple Vitamin (MULTIVITAMIN) capsule Take 1 capsule by mouth daily.  Marland Kitchen triamterene-hydrochlorothiazide (MAXZIDE-25) 37.5-25 MG tablet Take 0.5 tablets by mouth daily.   No facility-administered medications prior to visit.   Reviewed past medical and social history.   ROS per HPI above      Objective:  BP 122/80 (BP Location: Right Arm, Patient Position: Sitting, Cuff Size: Normal)   Pulse 60   Temp 97.9 F (36.6 C) (Temporal)   Ht 5\' 7"  (1.702 m)   Wt 188 lb (85.3 kg)   SpO2 98%   BMI 29.44 kg/m      Physical Exam  Results for orders placed or performed in visit on 11/25/23  Renal Function Panel  Result Value Ref Range   Sodium 140 135 - 145 mEq/L   Potassium 3.5 3.5 - 5.1 mEq/L   Chloride 102 96 - 112 mEq/L   CO2 30 19 - 32 mEq/L   Albumin 4.3 3.5 - 5.2 g/dL   BUN 10 6 - 23 mg/dL   Creatinine, Ser 4.09 0.40 - 1.20 mg/dL   Glucose, Bld 88 70 - 99 mg/dL   Phosphorus 4.0 2.3 - 4.6 mg/dL   GFR 81.19 >14.78 mL/min   Calcium 9.6 8.4 - 10.5 mg/dL      Assessment & Plan:    Problem List Items Addressed This Visit    HTN (hypertension)  Other Visit Diagnoses      Erroneous encounter - disregard    -  Primary      No follow-ups on file.     Alysia Penna, NP   This encounter was created in error - please disregard.

## 2023-11-26 ENCOUNTER — Encounter: Payer: Self-pay | Admitting: Nurse Practitioner

## 2023-11-26 NOTE — Progress Notes (Signed)
 Not seen. Wrong appointment type. Needed lab appt

## 2024-01-20 ENCOUNTER — Ambulatory Visit (INDEPENDENT_AMBULATORY_CARE_PROVIDER_SITE_OTHER): Payer: Managed Care, Other (non HMO) | Admitting: Nurse Practitioner

## 2024-01-20 ENCOUNTER — Encounter: Payer: Self-pay | Admitting: Nurse Practitioner

## 2024-01-20 VITALS — BP 122/74 | HR 76 | Temp 98.0°F | Ht 67.0 in | Wt 186.6 lb

## 2024-01-20 DIAGNOSIS — I10 Essential (primary) hypertension: Secondary | ICD-10-CM | POA: Diagnosis not present

## 2024-01-20 MED ORDER — TRIAMTERENE-HCTZ 37.5-25 MG PO TABS: 0.5000 | ORAL_TABLET | Freq: Every day | ORAL | 1 refills | Status: DC

## 2024-01-20 NOTE — Patient Instructions (Signed)
 Maintain Heart healthy diet and daily exercise. Maintain current medications.

## 2024-01-20 NOTE — Progress Notes (Signed)
                Established Patient Visit  Patient: Yvonne May   DOB: 1976-02-16   48 y.o. Female  MRN: [id number] Visit Date: 01/20/2024  Subjective:    Chief Complaint  Patient presents with   Follow-up    3 month f/u   HPI HTN (hypertension) BP at goal with maxzide BP Readings from Last 3 Encounters:  01/20/24 122/74  11/25/23 122/80  10/21/23 120/80    Maintain med dose F/up in 6months  BP Readings from Last 3 Encounters:  01/20/24 122/74  11/25/23 122/80  10/21/23 120/80    Reviewed medical, surgical, and social history today  Medications: Outpatient Medications Prior to Visit  Medication Sig   Ascorbic Acid (VITAMIN C) 1000 MG tablet Take 1,000 mg by mouth daily.   cholecalciferol (VITAMIN D3) 25 MCG (1000 UT) tablet Take 1,000 Units by mouth daily.   Multiple Vitamin (MULTIVITAMIN) capsule Take 1 capsule by mouth daily.   [DISCONTINUED] triamterene -hydrochlorothiazide (MAXZIDE-25) 37.5-25 MG tablet Take 0.5 tablets by mouth daily.   No facility-administered medications prior to visit.   Reviewed past medical and social history.   ROS per HPI above  Last metabolic panel Lab Results  Component Value Date   GLUCOSE 88 11/25/2023   NA 140 11/25/2023   K 3.5 11/25/2023   CL 102 11/25/2023   CO2 30 11/25/2023   BUN 10 11/25/2023   CREATININE 0.84 11/25/2023   GFR 82.20 11/25/2023   CALCIUM 9.6 11/25/2023   PHOS 4.0 11/25/2023   PROT 7.8 10/08/2023   ALBUMIN 4.3 11/25/2023   LABGLOB 2.9 11/21/2018   AGRATIO 1.5 11/21/2018   BILITOT 0.5 10/08/2023   ALKPHOS 59 10/08/2023   AST 20 10/08/2023   ALT 13 10/08/2023        Objective:  BP 122/74 (BP Location: Left Arm, Patient Position: Sitting, Cuff Size: Large)   Pulse 76   Temp 98 F (36.7 C) (Temporal)   Ht 5\' 7"  (1.702 m)   Wt 186 lb 9.6 oz (84.6 kg)   SpO2 99%   BMI 29.23 kg/m      Physical Exam Vitals and nursing note reviewed.  Cardiovascular:     Rate and Rhythm: Normal rate  and regular rhythm.     Pulses: Normal pulses.     Heart sounds: Normal heart sounds.  Pulmonary:     Effort: Pulmonary effort is normal.     Breath sounds: Normal breath sounds.  Musculoskeletal:     Right lower leg: No edema.     Left lower leg: No edema.  Neurological:     Mental Status: She is alert and oriented to person, place, and time.     No results found for any visits on 01/20/24.    Assessment & Plan:    Problem List Items Addressed This Visit     HTN (hypertension) - Primary   BP at goal with maxzide BP Readings from Last 3 Encounters:  01/20/24 122/74  11/25/23 122/80  10/21/23 120/80    Maintain med dose F/up in 6months      Relevant Medications   triamterene -hydrochlorothiazide (MAXZIDE-25) 37.5-25 MG tablet   Return in about 6 months (around 07/21/2024) for HTN, hyperlipidemia (fasting).     Kathrene Parents, NP

## 2024-01-20 NOTE — Assessment & Plan Note (Signed)
 BP at goal with maxzide BP Readings from Last 3 Encounters:  01/20/24 122/74  11/25/23 122/80  10/21/23 120/80    Maintain med dose F/up in 6months

## 2024-03-16 ENCOUNTER — Emergency Department (HOSPITAL_BASED_OUTPATIENT_CLINIC_OR_DEPARTMENT_OTHER)
Admission: EM | Admit: 2024-03-16 | Discharge: 2024-03-16 | Disposition: A | Discharge status: Home / Self Care | Attending: Emergency Medicine | Admitting: Emergency Medicine

## 2024-03-16 ENCOUNTER — Ambulatory Visit: Payer: Self-pay

## 2024-03-16 DIAGNOSIS — I10 Essential (primary) hypertension: Secondary | ICD-10-CM | POA: Diagnosis not present

## 2024-03-16 DIAGNOSIS — R6889 Other general symptoms and signs: Secondary | ICD-10-CM

## 2024-03-16 DIAGNOSIS — Z79899 Other long term (current) drug therapy: Secondary | ICD-10-CM | POA: Insufficient documentation

## 2024-03-16 DIAGNOSIS — R42 Dizziness and giddiness: Secondary | ICD-10-CM | POA: Diagnosis present

## 2024-03-16 LAB — CBC WITH DIFFERENTIAL/PLATELET
Abs Immature Granulocytes: 0.02 10*3/uL (ref 0.00–0.07)
Basophils Absolute: 0 10*3/uL (ref 0.0–0.1)
Basophils Relative: 1 %
Eosinophils Absolute: 0.1 10*3/uL (ref 0.0–0.5)
Eosinophils Relative: 1 %
HCT: 36.7 % (ref 36.0–46.0)
Hemoglobin: 11.6 g/dL — ABNORMAL LOW (ref 12.0–15.0)
Immature Granulocytes: 0 %
Lymphocytes Relative: 30 %
Lymphs Abs: 1.9 10*3/uL (ref 0.7–4.0)
MCH: 26.1 pg (ref 26.0–34.0)
MCHC: 31.6 g/dL (ref 30.0–36.0)
MCV: 82.5 fL (ref 80.0–100.0)
Monocytes Absolute: 0.4 10*3/uL (ref 0.1–1.0)
Monocytes Relative: 7 %
Neutro Abs: 4 10*3/uL (ref 1.7–7.7)
Neutrophils Relative %: 61 %
Platelets: 204 10*3/uL (ref 150–400)
RBC: 4.45 MIL/uL (ref 3.87–5.11)
RDW: 13.7 % (ref 11.5–15.5)
WBC: 6.5 10*3/uL (ref 4.0–10.5)
nRBC: 0 % (ref 0.0–0.2)

## 2024-03-16 LAB — BASIC METABOLIC PANEL WITH GFR
Anion gap: 12 (ref 5–15)
BUN: 14 mg/dL (ref 6–20)
CO2: 24 mmol/L (ref 22–32)
Calcium: 9.8 mg/dL (ref 8.9–10.3)
Chloride: 105 mmol/L (ref 98–111)
Creatinine, Ser: 0.83 mg/dL (ref 0.44–1.00)
GFR, Estimated: >60 mL/min (ref >60)
Glucose, Bld: 103 mg/dL — ABNORMAL HIGH (ref 70–99)
Potassium: 3.7 mmol/L (ref 3.5–5.1)
Sodium: 141 mmol/L (ref 135–145)

## 2024-03-16 MED ORDER — LACTATED RINGERS IV BOLUS
1000.0000 mL | Freq: Once | INTRAVENOUS | Status: AC
  Administered 2024-03-16: 1000 mL via INTRAVENOUS

## 2024-03-16 NOTE — ED Triage Notes (Signed)
 Headache and feels not right  (Lightheaded) x 2 days. Thinks her b/p is up.

## 2024-03-16 NOTE — Telephone Encounter (Signed)
 FYI Only or Action Required?: FYI only for provider.  Patient was last seen in primary care on 01/20/2024 by Nche, Roselie Rockford, NP. Called Nurse Triage reporting Fatigue. Symptoms began several days ago. Interventions attempted: Nothing. Symptoms are: unchanged.  Triage Disposition: See Physician Within 24 Hours  Patient/caregiver understands and will follow disposition?: Yes, will follow disposition  Copied from CRM [id number]. Topic: Clinical - Red Word Triage >> Mar 16, 2024 12:16 PM Franky GRADE wrote: Red Word that prompted transfer to Nurse Triage: Patient is calling because she visited the emergency room due to not feeling well, she was discharged but still doesn't feel like herself. Symptoms are weakness, she is having difficulty swallowing, states she feels like a knot in her throat. Reason for Disposition  Taking a medicine that could cause weakness (e.g., blood pressure medications, diuretics)  Answer Assessment - Initial Assessment Questions 1. DESCRIPTION: Describe how you are feeling.     Still feel weak 2. SEVERITY: How bad is it?  Can you stand and walk?   - MILD (0-3): Feels weak or tired, but does not interfere with work, school or normal activities.   - MODERATE (4-7): Able to stand and walk; weakness interferes with work, school, or normal activities.   - SEVERE (8-10): Unable to stand or walk; unable to do usual activities.     mild 3. ONSET: When did these symptoms begin? (e.g., hours, days, weeks, months)     Intermittent, sometimes after walking a distance I feel like I can't balance 4. CAUSE: What do you think is causing the weakness or fatigue? (e.g., not drinking enough fluids, medical problem, trouble sleeping)     Pt states she thought it would be her BP, 133/99 was checked at home. 133/88 was most recent 5. NEW MEDICINES:  Have you started on any new medicines recently? (e.g., opioid pain medicines, benzodiazepines, muscle relaxants, antidepressants,  antihistamines, neuroleptics, beta blockers)     denies 6. OTHER SYMPTOMS: Do you have any other symptoms? (e.g., chest pain, fever, cough, SOB, vomiting, diarrhea, bleeding, other areas of pain)     Throat feels like know it throat,   Pt was in the ED last evening d/t weakness and difficulty swallowing when supine. Pt states that she is still feeling the same. Pt was told the throat could be acid reflux. Pt was also given fluids and told to f/u with PCP.  Protocols used: Weakness (Generalized) and Fatigue-A-AH

## 2024-03-16 NOTE — ED Provider Notes (Signed)
 [facility]  Provider Note  CSN: [id number] Arrival date & time: 03/16/24 0134  History Chief Complaint  Patient presents with   Hypertension    Neave R Umholtz is a 48 y.o. female with history of HTN, on Maxzide, reports 2-3 days of general ill feeling. Having some light-headedness, occasional vague visual changes (no loss of vision) and a feeling of her throat closing when she lies down at night. Typically BP is well controlled but has been mildly elevated the last few days. Denies any fever, CP, SOB, vomiting, diarrhea or urinary symptoms. She has been compliant with meds. Not a diabetic.    Home Medications Prior to Admission medications   Medication Sig Start Date End Date Taking? Authorizing Provider  Ascorbic Acid (VITAMIN C) 1000 MG tablet Take 1,000 mg by mouth daily.    [provider]  cholecalciferol (VITAMIN D3) 25 MCG (1000 UT) tablet Take 1,000 Units by mouth daily.    [provider]  Multiple Vitamin (MULTIVITAMIN) capsule Take 1 capsule by mouth daily.    [provider]  triamterene -hydrochlorothiazide (MAXZIDE-25) 37.5-25 MG tablet Take 0.5 tablets by mouth daily. 01/20/24   Nche, Roselie Rockford, NP     Allergies    Penicillins   Review of Systems   Review of Systems Please see HPI for pertinent positives and negatives  Physical Exam BP 127/81   Pulse (!) 57   Temp 98.4 F (36.9 C) (Oral)   Resp 14   Ht 5' 7 (1.702 m)   Wt 83.9 kg   SpO2 99%   BMI 28.98 kg/m   Physical Exam Vitals and nursing note reviewed.  Constitutional:      Appearance: Normal appearance.  HENT:     Head: Normocephalic and atraumatic.     Nose: Nose normal.     Mouth/Throat:     Mouth: Mucous membranes are moist.   Eyes:     Extraocular Movements: Extraocular movements intact.     Conjunctiva/sclera: Conjunctivae normal.    Cardiovascular:     Rate and Rhythm: Normal rate.  Pulmonary:      Effort: Pulmonary effort is normal.     Breath sounds: Normal breath sounds.  Abdominal:     General: Abdomen is flat.     Palpations: Abdomen is soft.     Tenderness: There is no abdominal tenderness.   Musculoskeletal:        General: No swelling. Normal range of motion.     Cervical back: Neck supple.   Skin:    General: Skin is warm and dry.   Neurological:     General: No focal deficit present.     Mental Status: She is alert and oriented to person, place, and time.     Cranial Nerves: No cranial nerve deficit.     Sensory: No sensory deficit.     Motor: No weakness.     Gait: Gait normal.   Psychiatric:        Mood and Affect: Mood normal.     ED Results / Procedures / Treatments   EKG EKG Interpretation Date/Time:  Monday March 16 2024 03:12:26 EDT Ventricular Rate:  62 PR Interval:  164 QRS Duration:  98 QT Interval:  456 QTC Calculation: 464 R Axis:   26  Text Interpretation: Sinus rhythm Normal ECG No old tracing to compare Confirmed by Roselyn Dunnings [phone]) on 03/16/2024 3:15:27 AM  Procedures Procedures  Medications Ordered in the ED  Medications  lactated ringers bolus 1,000 mL ( Intravenous Stopped 03/16/24 0411)    Initial Impression and Plan  Patient here with vague symptoms of weakness and general ill feeling. BP is borderline elevated in triage, but otherwise exam and vitals are reassuring. Will check labs and give IVF for comfort.   ED Course   Clinical Course as of 03/16/24 0435  Mon Mar 16, 2024  0321 CBC is unremarkable.  [CS]  0338 BMP is unremarkable.  [CS]  [id number] Patient reports she is feeling a bit better after IVF. BP improved without intervention. She is not orthostatic with standing and no hypoxia or tachycardia with walking. No definite cause of her symptoms identified but does not appear to be an emergent condition and no indication for admission. Recommend she continue with her home meds, ensure oral hydration and follow up with  PCP. Her feeling of throat closing with lying down could be GERD related although no history of same. Recommend she sleep propped on pillows and discuss PPI with PCP. RTED for any other concerns.  [CS]    Clinical Course User Index [CS] Roselyn Carlin NOVAK, MD     MDM Rules/Calculators/A&P Medical Decision Making Problems Addressed: General ill feeling: acute illness or injury Primary hypertension: chronic illness or injury  Amount and/or Complexity of Data Reviewed Labs: ordered. Decision-making details documented in ED Course. ECG/medicine tests: ordered and independent interpretation performed. Decision-making details documented in ED Course.  Risk Prescription drug management.     Final Clinical Impression(s) / ED Diagnoses Final diagnoses:  General ill feeling  Primary hypertension    Rx / DC Orders ED Discharge Orders     None        Roselyn Carlin NOVAK, MD 03/16/24 [phone]

## 2024-03-16 NOTE — ED Notes (Signed)
 Patient ambulated around the department twice with continuous pulse oximetry monitor. Oxygen saturation remained above 95% the entire time and HR remained in the 70s. Provider aware.

## 2024-03-18 ENCOUNTER — Ambulatory Visit (INDEPENDENT_AMBULATORY_CARE_PROVIDER_SITE_OTHER): Admitting: Nurse Practitioner

## 2024-03-18 ENCOUNTER — Encounter: Payer: Self-pay | Admitting: Nurse Practitioner

## 2024-03-18 VITALS — BP 136/80 | HR 70 | Temp 98.3°F | Ht 67.0 in | Wt 184.4 lb

## 2024-03-18 DIAGNOSIS — I10 Essential (primary) hypertension: Secondary | ICD-10-CM

## 2024-03-18 DIAGNOSIS — R09A2 Foreign body sensation, throat: Secondary | ICD-10-CM | POA: Diagnosis not present

## 2024-03-18 MED ORDER — LISINOPRIL-HYDROCHLOROTHIAZIDE 10-12.5 MG PO TABS
1.0000 | ORAL_TABLET | Freq: Every day | ORAL | 5 refills | Status: DC
Start: 2024-03-18 — End: 2024-05-07

## 2024-03-18 MED ORDER — OMEPRAZOLE 20 MG PO CPDR: 20.0000 mg | DELAYED_RELEASE_CAPSULE | Freq: Every day | ORAL | 0 refills | Status: DC

## 2024-03-18 NOTE — Assessment & Plan Note (Addendum)
 Reports intermittent feeling of generalized malaise, fatigue, and lightheadedness. Worse in last 2weeks. Home BP readings: 140s-130s/80s-90s Current use of maxzide 0.5tab daily BP Readings from Last 3 Encounters:  03/18/24 136/80  03/16/24 127/81  01/20/24 122/74    Normal CBC, CMP, THYROID  within the last 6months. Stop maxzide Start lisinopril/hydrochlorothiazide 10/12.5mg  to optimize BP control Encouraged to maintain adequate oral hydration F/up in 56month

## 2024-03-18 NOTE — Patient Instructions (Signed)
 Go to lab Stop maxzide Start lisinopril/hydrochlorothiazide 1tab daily. Maintain appointment with ophthalmology. Maintain at least 60-70oz of water daily.

## 2024-03-18 NOTE — Assessment & Plan Note (Signed)
 Onset 2weeks ago, associated to sensation of food getting stuck. Denies any heart burn or epigastric pain or hoarseness. Last meal to bedtime. Does not skip meals. No tobacco or ALCOHOL use.  Possibly esophagitis due to uncontrolled GERD? Check H. Pylori 14days trial of PPI Ref to GI if no improvement Advised to avoid meal within 2-3hrs of bedtime

## 2024-03-18 NOTE — Progress Notes (Signed)
 Established Patient Visit  Patient: Yvonne May   DOB: October 27, 1975   48 y.o. Female  MRN: [id number] Visit Date: 03/18/2024  Subjective:    Chief Complaint  Patient presents with   Fatigue    Fatigue/weakness for a couple of weeks, light headedness and blurred vision, feels like throat is closing at night sleeps on stomach    HPI HTN (hypertension) Reports intermittent feeling of generalized malaise, fatigue, and lightheadedness. Worse in last 2weeks. Home BP readings: 140s-130s/80s-90s Current use of maxzide 0.5tab daily BP Readings from Last 3 Encounters:  03/18/24 136/80  03/16/24 127/81  01/20/24 122/74    Normal CBC, CMP, THYROID  within the last 6months. Stop maxzide Start lisinopril/hydrochlorothiazide 10/12.5mg  to optimize BP control Encouraged to maintain adequate oral hydration F/up in 56month   Globus pharyngeus Onset 2weeks ago, associated to sensation of food getting stuck. Denies any heart burn or epigastric pain or hoarseness. Last meal to bedtime. Does not skip meals. No tobacco or ALCOHOL use.  Possibly esophagitis due to uncontrolled GERD? Check H. Pylori 14days trial of PPI Ref to GI if no improvement Advised to avoid meal within 2-3hrs of bedtime  Reviewed medical, surgical, and social history today  Medications: Outpatient Medications Prior to Visit  Medication Sig   Ascorbic Acid (VITAMIN C) 1000 MG tablet Take 1,000 mg by mouth daily.   cholecalciferol (VITAMIN D3) 25 MCG (1000 UT) tablet Take 1,000 Units by mouth daily.   fluticasone  (FLONASE ) 50 MCG/ACT nasal spray Place 1 spray into both nostrils in the morning and at bedtime.   Multiple Vitamin (MULTIVITAMIN) capsule Take 1 capsule by mouth daily.   [DISCONTINUED] triamterene -hydrochlorothiazide (MAXZIDE-25) 37.5-25 MG tablet Take 0.5 tablets by mouth daily.   No facility-administered medications prior to visit.   Reviewed past medical and social history.    ROS per HPI above      Objective:  BP 136/80 (BP Location: Left Arm, Patient Position: Sitting, Cuff Size: Normal)   Pulse 70   Temp 98.3 F (36.8 C) (Oral)   Ht 5' 7 (1.702 m)   Wt 184 lb 6.4 oz (83.6 kg)   SpO2 99%   BMI 28.88 kg/m      Physical Exam Vitals and nursing note reviewed.  HENT:     Mouth/Throat:     Pharynx: Oropharynx is clear. Uvula midline.     Tonsils: No tonsillar exudate.  Neck:     Thyroid : No thyroid  mass, thyromegaly or thyroid  tenderness.   Cardiovascular:     Rate and Rhythm: Normal rate and regular rhythm.     Pulses: Normal pulses.     Heart sounds: Normal heart sounds.  Pulmonary:     Effort: Pulmonary effort is normal.     Breath sounds: Normal breath sounds.   Musculoskeletal:     Cervical back: Normal range of motion.  Lymphadenopathy:     Cervical: No cervical adenopathy.   Neurological:     Mental Status: She is alert and oriented to person, place, and time.     No results found for any visits on 03/18/24.    Assessment & Plan:    Problem List Items Addressed This Visit     Globus pharyngeus   Onset 2weeks ago, associated to sensation of food getting stuck. Denies any heart burn or epigastric pain or hoarseness. Last meal to bedtime. Does not skip meals. No tobacco or ALCOHOL use.  Possibly esophagitis due to uncontrolled GERD? Check H. Pylori 14days trial of PPI Ref to GI if no improvement Advised to avoid meal within 2-3hrs of bedtime       Relevant Medications   omeprazole (PRILOSEC) 20 MG capsule   Other Relevant Orders   H. pylori breath test   HTN (hypertension) - Primary   Reports intermittent feeling of generalized malaise, fatigue, and lightheadedness. Worse in last 2weeks. Home BP readings: 140s-130s/80s-90s Current use of maxzide 0.5tab daily BP Readings from Last 3 Encounters:  03/18/24 136/80  03/16/24 127/81  01/20/24 122/74    Normal CBC, CMP, THYROID  within the last 6months. Stop  maxzide Start lisinopril/hydrochlorothiazide 10/12.5mg  to optimize BP control Encouraged to maintain adequate oral hydration F/up in 1month       Relevant Medications   lisinopril-hydrochlorothiazide (ZESTORETIC) 10-12.5 MG tablet   Return in about 4 weeks (around 04/15/2024) for HTN.     Roselie Mood, NP

## 2024-03-19 ENCOUNTER — Other Ambulatory Visit: Payer: Self-pay | Admitting: Nurse Practitioner

## 2024-03-19 DIAGNOSIS — Z1231 Encounter for screening mammogram for malignant neoplasm of breast: Secondary | ICD-10-CM

## 2024-03-19 LAB — H. PYLORI BREATH TEST: H. pylori Breath Test: NOT DETECTED

## 2024-03-19 NOTE — Telephone Encounter (Signed)
 Noted. Patient was seen in office by [facility] on 03/17/24.

## 2024-03-20 ENCOUNTER — Ambulatory Visit: Payer: Self-pay | Admitting: Nurse Practitioner

## 2024-03-24 ENCOUNTER — Encounter

## 2024-03-24 DIAGNOSIS — Z1231 Encounter for screening mammogram for malignant neoplasm of breast: Secondary | ICD-10-CM

## 2024-03-25 ENCOUNTER — Ambulatory Visit
Admission: RE | Admit: 2024-03-25 | Discharge: 2024-03-25 | Disposition: A | Discharge status: Home / Self Care | Source: Ambulatory Visit | Attending: Nurse Practitioner | Admitting: Nurse Practitioner

## 2024-03-25 DIAGNOSIS — Z1231 Encounter for screening mammogram for malignant neoplasm of breast: Secondary | ICD-10-CM

## 2024-04-29 ENCOUNTER — Ambulatory Visit: Admitting: Nurse Practitioner

## 2024-05-07 ENCOUNTER — Encounter: Payer: Self-pay | Admitting: Nurse Practitioner

## 2024-05-07 ENCOUNTER — Ambulatory Visit (INDEPENDENT_AMBULATORY_CARE_PROVIDER_SITE_OTHER): Admitting: Nurse Practitioner

## 2024-05-07 VITALS — BP 116/77 | HR 68 | Temp 98.6°F | Ht 67.0 in | Wt 184.0 lb

## 2024-05-07 DIAGNOSIS — I10 Essential (primary) hypertension: Secondary | ICD-10-CM | POA: Diagnosis not present

## 2024-05-07 MED ORDER — LISINOPRIL-HYDROCHLOROTHIAZIDE 10-12.5 MG PO TABS: 1.0000 | ORAL_TABLET | Freq: Every day | ORAL | 1 refills | Status: DC

## 2024-05-07 NOTE — Progress Notes (Signed)
                Established Patient Visit  Patient: Yvonne May   DOB: 03/22/1976   48 y.o. Female  MRN: [id number] Visit Date: 05/07/2024  Subjective:    Chief Complaint  Patient presents with   Follow-up    4 week follow up for HTN    HPI HTN (hypertension) Improved an stable BP with lisinopril/hydrochlorothiazide No adverse effects Home BP machine verified and accurate. BP Readings from Last 3 Encounters:  05/07/24 116/77  03/18/24 136/80  03/16/24 127/81    Maintain med dose, DASH diet and daily exercise F/up in 5months  Wt Readings from Last 3 Encounters:  05/07/24 184 lb (83.5 kg)  03/18/24 184 lb 6.4 oz (83.6 kg)  03/16/24 185 lb (83.9 kg)     Reviewed medical, surgical, and social history today  Medications: Outpatient Medications Prior to Visit  Medication Sig   Ascorbic Acid (VITAMIN C) 1000 MG tablet Take 1,000 mg by mouth daily.   cholecalciferol (VITAMIN D3) 25 MCG (1000 UT) tablet Take 1,000 Units by mouth daily.   fluticasone (FLONASE) 50 MCG/ACT nasal spray Place 1 spray into both nostrils in the morning and at bedtime.   Multiple Vitamin (MULTIVITAMIN) capsule Take 1 capsule by mouth daily.   [DISCONTINUED] lisinopril-hydrochlorothiazide (ZESTORETIC) 10-12.5 MG tablet Take 1 tablet by mouth daily.   omeprazole (PRILOSEC) 20 MG capsule Take 1 capsule (20 mg total) by mouth daily. (Patient not taking: Reported on 05/07/2024)   No facility-administered medications prior to visit.   Reviewed past medical and social history.   ROS per HPI above      Objective:  BP 116/77 (BP Location: Left Arm, Patient Position: Sitting) Comment: Patient automatic machine  Pulse 68   Temp 98.6 F (37 C) (Oral)   Ht 5' 7 (1.702 m)   Wt 184 lb (83.5 kg)   SpO2 98%   BMI 28.82 kg/m      Physical Exam Vitals and nursing note reviewed.  Cardiovascular:     Rate and Rhythm: Normal rate.     Pulses: Normal pulses.  Pulmonary:     Effort: Pulmonary  effort is normal.  Neurological:     Mental Status: She is alert and oriented to person, place, and time.     No results found for any visits on 05/07/24.    Assessment & Plan:    Problem List Items Addressed This Visit     HTN (hypertension) - Primary   Improved an stable BP with lisinopril/hydrochlorothiazide No adverse effects Home BP machine verified and accurate. BP Readings from Last 3 Encounters:  05/07/24 116/77  03/18/24 136/80  03/16/24 127/81    Maintain med dose, DASH diet and daily exercise F/up in 5months      Relevant Medications   lisinopril-hydrochlorothiazide (ZESTORETIC) 10-12.5 MG tablet   Return in about 5 months (around 10/07/2024) for CPE (fasting).     Roselie Mood, NP

## 2024-05-07 NOTE — Patient Instructions (Signed)
 Maintain Heart healthy diet and daily exercise. Maintain current medications.

## 2024-05-07 NOTE — Assessment & Plan Note (Signed)
 Improved an stable BP with lisinopril/hydrochlorothiazide No adverse effects Home BP machine verified and accurate. BP Readings from Last 3 Encounters:  05/07/24 116/77  03/18/24 136/80  03/16/24 127/81    Maintain med dose, DASH diet and daily exercise F/up in 5months

## 2024-07-21 ENCOUNTER — Ambulatory Visit: Admitting: Nurse Practitioner

## 2024-08-31 ENCOUNTER — Ambulatory Visit: Payer: Self-pay

## 2024-08-31 NOTE — Telephone Encounter (Signed)
 FYI Only or Action Required?: FYI only for provider: appointment scheduled on tomorrow.  Patient was last seen in primary care on 05/07/2024 by Nche, Roselie Rockford, NP.  Called Nurse Triage reporting Rectal Bleeding.  Symptoms began yesterday.  Interventions attempted: Nothing.  Symptoms are: unchanged.  Triage Disposition: See PCP Within 2 Weeks  Patient/caregiver understands and will follow disposition?: Yes, will follow disposition  Copied from CRM [id number]. Topic: Clinical - Red Word Triage >> Aug 31, 2024 11:34 AM Lonell PEDLAR wrote: Red Word that prompted transfer to Nurse Triage: Blood in stool, bright red. 'feels off' Patient noticed she received a different pill for her BP Old pill, blue. New pill, white. Reason for Disposition  [1] Rectal bleeding is minimal (e.g., blood just on toilet paper, few drops, streaks on surface of normal formed BM) AND [2] bleeding recurs 3 or more times after using Care Advice  Answer Assessment - Initial Assessment Questions 1. APPEARANCE of BLOOD: What color is it? Is it passed separately, on the surface of the stool, or mixed in with the stool?      Bright red on toilet paper, and in water 2. AMOUNT: How much blood was passed?      Pt unsure 3. FREQUENCY: How many times has blood been passed with the stools?      Pt states that it is every time she has a BM 4. ONSET: When was the blood first seen in the stools? (Days or weeks)      yesterday 5. DIARRHEA: Is there also some diarrhea? If Yes, ask: How many diarrhea stools in the past 24 hours?      denies 6. CONSTIPATION: Do you have constipation? If Yes, ask: How bad is it?     Pt is having constipation 8. BLOOD THINNERS: Do you take any blood thinners? (e.g., aspirin, clopidogrel / Plavix, coumadin, heparin). Notes: Other strong blood thinners include: Arixtra (fondaparinux), Eliquis (apixaban), Pradaxa (dabigatran), and Xarelto (rivaroxaban).     denies 9. OTHER SYMPTOMS:  Do you have any other symptoms?  (e.g., abdomen pain, vomiting, dizziness, fever)     Pt states that she has had episodes where she feels off, pt describes feeling drained. Denies dizziness, states she feels off balanced at times. Pt denies feeling off balanced at time of call. Denies vision changes. Denies speech changes.  Protocols used: Rectal Bleeding-A-AH

## 2024-08-31 NOTE — Telephone Encounter (Signed)
 Noted patient schedule for office appointment

## 2024-09-01 ENCOUNTER — Ambulatory Visit: Payer: Self-pay | Admitting: Nurse Practitioner

## 2024-09-01 ENCOUNTER — Ambulatory Visit: Admitting: Nurse Practitioner

## 2024-09-01 ENCOUNTER — Encounter: Payer: Self-pay | Admitting: Nurse Practitioner

## 2024-09-01 VITALS — BP 116/72 | HR 76 | Temp 97.8°F | Ht 67.0 in | Wt 181.2 lb

## 2024-09-01 DIAGNOSIS — R42 Dizziness and giddiness: Secondary | ICD-10-CM

## 2024-09-01 DIAGNOSIS — I10 Essential (primary) hypertension: Secondary | ICD-10-CM

## 2024-09-01 DIAGNOSIS — K644 Residual hemorrhoidal skin tags: Secondary | ICD-10-CM

## 2024-09-01 LAB — CBC
HCT: 38.3 % (ref 36.0–46.0)
Hemoglobin: 12.3 g/dL (ref 12.0–15.0)
MCHC: 32.2 g/dL (ref 30.0–36.0)
MCV: 82.8 fl (ref 78.0–100.0)
Platelets: 225 K/uL (ref 150.0–400.0)
RBC: 4.62 Mil/uL (ref 3.87–5.11)
RDW: 14.1 % (ref 11.5–15.5)
WBC: 5.4 K/uL (ref 4.0–10.5)

## 2024-09-01 LAB — RENAL FUNCTION PANEL
Albumin: 4.4 g/dL (ref 3.5–5.2)
BUN: 11 mg/dL (ref 6–23)
CO2: 30 meq/L (ref 19–32)
Calcium: 10 mg/dL (ref 8.4–10.5)
Chloride: 103 meq/L (ref 96–112)
Creatinine, Ser: 1 mg/dL (ref 0.40–1.20)
GFR: 66.32 mL/min (ref >60.00)
Glucose, Bld: 88 mg/dL (ref 70–99)
Phosphorus: 3.5 mg/dL (ref 2.3–4.6)
Potassium: 3.9 meq/L (ref 3.5–5.1)
Sodium: 141 meq/L (ref 135–145)

## 2024-09-01 MED ORDER — HYDROCORTISONE ACETATE 25 MG RE SUPP: 25.0000 mg | Freq: Two times a day (BID) | RECTAL | 0 refills | Status: DC

## 2024-09-01 NOTE — Progress Notes (Signed)
 Established Patient Visit  Patient: Yvonne May   DOB: 1976/01/13   48 y.o. Female  MRN: [id number] Visit Date: 09/01/2024  Subjective:    Chief Complaint  Patient presents with   Rectal Bleeding    Started 2-3 days ago only when wiping    Rectal Bleeding  The current episode started 3 to 5 days ago. The onset was gradual. The problem occurs occasionally. The problem has been unchanged. The patient is experiencing no pain. The stool is described as soft. There was no prior successful therapy. There was no prior unsuccessful therapy. Associated symptoms include hemorrhoids. Pertinent negatives include no anorexia, no fever, no abdominal pain, no diarrhea, no hematemesis, no nausea, no rectal pain, no vomiting, no hematuria, no vaginal bleeding, no vaginal discharge, no chest pain, no headaches, no coughing, no difficulty breathing and no rash. She has been Behaving normally. She has been Eating and drinking normally. Urine output has been normal. Her past medical history does not include recent antibiotic use, recent change in diet or a recent illness. There were no sick contacts. She has received no recent medical care.  Dizziness This is a recurrent problem. The current episode started more than 1 month ago (3months ago). The problem occurs intermittently. The problem has been unchanged. Pertinent negatives include no abdominal pain, anorexia, change in bowel habit, chest pain, chills, congestion, coughing, fever, headaches, myalgias, nausea, numbness, rash, vertigo, visual change, vomiting or weakness. Nothing (no known trigger, occurs randomly during the day, resolves spontaneously) aggravates the symptoms. She has tried nothing for the symptoms.  Up to date with colonoscopy: no polyps, internal and exeternal hemorrhoids noted.  Reviewed medical, surgical, and social history today  Medications: Outpatient Medications Prior to Visit  Medication Sig   Ascorbic Acid  (VITAMIN C) 1000 MG tablet Take 1,000 mg by mouth daily.   cholecalciferol (VITAMIN D3) 25 MCG (1000 UT) tablet Take 1,000 Units by mouth daily.   fluticasone  (FLONASE ) 50 MCG/ACT nasal spray Place 1 spray into both nostrils in the morning and at bedtime.   lisinopril -hydrochlorothiazide  (ZESTORETIC ) 10-12.5 MG tablet Take 1 tablet by mouth daily.   Multiple Vitamin (MULTIVITAMIN) capsule Take 1 capsule by mouth daily.   omeprazole  (PRILOSEC) 20 MG capsule Take 1 capsule (20 mg total) by mouth daily.   No facility-administered medications prior to visit.   Reviewed past medical and social history.   ROS per HPI above      Objective:  BP 116/72 (BP Location: Left Arm, Patient Position: Sitting, Cuff Size: Large)   Pulse 76   Temp 97.8 F (36.6 C) (Oral)   Ht 5' 7 (1.702 m)   Wt 181 lb 3.2 oz (82.2 kg)   BMI 28.38 kg/m      Physical Exam Vitals and nursing note reviewed.  Cardiovascular:     Rate and Rhythm: Normal rate.     Pulses: Normal pulses.  Pulmonary:     Effort: Pulmonary effort is normal.  Abdominal:     General: There is no distension.     Palpations: Abdomen is soft.     Tenderness: There is no abdominal tenderness.  Genitourinary:    Rectum: External hemorrhoid present. No tenderness or anal fissure.  Neurological:     Mental Status: She is alert and oriented to person, place, and time.     No results found for any visits on 09/01/24.    Assessment &  Plan:    Problem List Items Addressed This Visit     HTN (hypertension) - Primary   Relevant Orders   Renal Function Panel   Other Visit Diagnoses       Intermittent lightheadedness       Relevant Orders   CBC     Bleeding external hemorrhoids       Relevant Medications   hydrocortisone  (ANUSOL -HC) 25 MG suppository       Consider GI referral if no improvement. Advised about need for sitz bathes, adequate oral hydration and high fiber diet.  Return if symptoms worsen or fail to improve.      Roselie Mood, NP

## 2024-09-01 NOTE — Patient Instructions (Addendum)
 Go to lab Maintain Heart healthy diet Maintain current medications.  Hemorrhoids Hemorrhoids are swollen veins in and around the rectum or the opening of the butt (anus). There are two types of hemorrhoids: Internal. These occur in the veins just inside the rectum. They may poke through to the outside and become irritated and painful. External. These occur in the veins outside the anus. They can be felt as a painful swelling or hard lump near the anus. Most hemorrhoids do not cause severe problems. Often, they can be treated at home with diet and lifestyle changes. If home treatments do not help, you may need a procedure to shrink or remove the hemorrhoids. What are the causes? Hemorrhoids are caused by pressure near the anus. This pressure may be caused by: Constipation or diarrhea. Straining to poop. Pregnancy. Obesity. Sitting or riding a bike for a long time. Heavy lifting or other things that cause you to strain. Anal sex. What are the signs or symptoms? Symptoms of this condition include: Pain. Anal itching or irritation. Bleeding from the rectum. Leakage of poop (stool). Swelling of the anus. One or more lumps around the anus. How is this diagnosed? Hemorrhoids can often be diagnosed through a visual exam. Other exams or tests may also be done, such as: A digital rectal exam. This is when your health care provider feels inside your rectum with a gloved finger. Anoscope. This is an exam of the anus using a small tube. A blood test, if you have lost a lot of blood. A sigmoidoscopy or colonoscopy. These are tests to look inside the colon using a tube with a camera on the end. How is this treated? In most cases, hemorrhoids can be treated at home with diet and lifestyle changes. If these changes do not help, you may need to have a procedure done. These procedures can make the hemorrhoids smaller or fully remove them. Common procedures include: Rubber band ligation. Rubber bands  are placed at the base of the hemorrhoids to cut off their blood supply. Sclerotherapy. Medicine is put into the hemorrhoids to shrink them. Infrared coagulation. A type of light energy is used to get rid of the hemorrhoids. Hemorrhoidectomy surgery. The hemorrhoids are removed during surgery. Then, the veins that supply them are tied off. Stapled hemorrhoidopexy surgery. The base of the hemorrhoid is stapled to the wall of the rectum. Follow these instructions at home: Medicines Take over-the-counter and prescription medicines only as told by your provider. Use medicated creams or medicines that are put in the rectum (suppositories) as told by your provider. Eating and drinking  Eat foods that are high in fiber, such as beans, whole grains, and fresh fruits and vegetables. Ask your provider about taking products that have fiber added to them (fiber supplements). Reduce the amount of fat in your diet. You can do this by eating low-fat dairy products, eating less red meat, and avoiding processed foods. Drink enough fluid to keep your pee (urine) pale yellow. Managing pain and swelling  Take warm sitz baths for 20 minutes, 3-4 times a day. This can help ease pain and discomfort. You may do this in a bathtub or you can use a portable sitz bath that fits over the toilet. If told, put ice on the affected area. It may help to use ice packs between sitz baths. Put ice in a plastic bag. Place a towel between your skin and the bag. Leave the ice on for 20 minutes, 2-3 times a day. If your  skin turns bright red, remove the ice right away to prevent skin damage. The risk of damage is higher if you cannot feel pain, heat, or cold. General instructions Exercise. Ask your provider how much and what kind of exercise is best for you. In general, you should do moderate exercise for at least 30 minutes on most days of the week (150 minutes each week). You may want to try walking, biking, or yoga. Go to the  bathroom when you have the urge to poop. Do not wait. Avoid straining to poop. Keep the anus dry and clean. Use wet toilet paper or moist towelettes after you poop. Do not sit on the toilet for a long time. This can increase blood pooling and pain. Where to find more information [employer] and Digestive [employer] Kidney Diseases: stagesync.si Contact a health care provider if: You have more pain and swelling that do not get better with treatment. You have trouble pooping or you are not able to poop. You have pain or inflammation outside the area of the hemorrhoids. Get help right away if: You are bleeding from your rectum and you cannot get it to stop. This information is not intended to replace advice given to you by your health care provider. Make sure you discuss any questions you have with your health care provider. Document Revised: 05/23/2022 Document Reviewed: 05/23/2022 Elsevier Patient Education  2024 [employer].

## 2024-10-08 ENCOUNTER — Ambulatory Visit: Admitting: Nurse Practitioner

## 2024-10-08 ENCOUNTER — Encounter: Payer: Self-pay | Admitting: Nurse Practitioner

## 2024-10-08 VITALS — BP 118/72 | HR 62 | Temp 97.6°F | Ht 67.0 in | Wt 182.4 lb

## 2024-10-08 DIAGNOSIS — E7800 Pure hypercholesterolemia, unspecified: Secondary | ICD-10-CM | POA: Diagnosis not present

## 2024-10-08 DIAGNOSIS — Z0000 Encounter for general adult medical examination without abnormal findings: Secondary | ICD-10-CM

## 2024-10-08 DIAGNOSIS — I10 Essential (primary) hypertension: Secondary | ICD-10-CM

## 2024-10-08 LAB — HEPATIC FUNCTION PANEL
ALT: 12 U/L (ref 3–35)
AST: 20 U/L (ref 5–37)
Albumin: 4.3 g/dL (ref 3.5–5.2)
Alkaline Phosphatase: 54 U/L (ref 39–117)
Bilirubin, Direct: 0.1 mg/dL (ref 0.1–0.3)
Total Bilirubin: 0.5 mg/dL (ref 0.2–1.2)
Total Protein: 7.6 g/dL (ref 6.0–8.3)

## 2024-10-08 LAB — LIPID PANEL
Cholesterol: 176 mg/dL (ref 28–200)
HDL: 66.4 mg/dL
LDL Cholesterol: 99 mg/dL (ref 10–99)
NonHDL: 109.49
Total CHOL/HDL Ratio: 3
Triglycerides: 50 mg/dL (ref 10.0–149.0)
VLDL: 10 mg/dL (ref 0.0–40.0)

## 2024-10-08 MED ORDER — LISINOPRIL-HYDROCHLOROTHIAZIDE 10-12.5 MG PO TABS: 1.0000 | ORAL_TABLET | Freq: Every day | ORAL | 1 refills | Status: AC

## 2024-10-08 NOTE — Patient Instructions (Signed)
 Go to lab Maintain Heart healthy diet and daily exercise. Maintain current medications.

## 2024-10-08 NOTE — Progress Notes (Signed)
 "  Complete physical exam  Patient: Yvonne May   DOB: March 21, 1976   49 y.o. Female  MRN: [id number] Visit Date: 10/08/2024  Subjective:    Chief Complaint  Patient presents with   Annual Exam    FASTING   Yvonne May is a 49 y.o. female who presents today for a complete physical exam. She reports consuming a low sodium diet. No exercise regimen She generally feels well. She reports sleeping well. She does not have additional problems to discuss today.  Vision:Yes Dental:Yes STD Screen:No  BP Readings from Last 3 Encounters:  10/08/24 118/72  09/01/24 116/72  05/07/24 116/77   Wt Readings from Last 3 Encounters:  10/08/24 182 lb 6.4 oz (82.7 kg)  09/01/24 181 lb 3.2 oz (82.2 kg)  05/07/24 184 lb (83.5 kg)   Most recent fall risk assessment:    10/08/2024    8:31 AM  Fall Risk   Falls in the past year? 0  Number falls in past yr: 0  Injury with Fall? 0  Risk for fall due to : No Fall Risks  Follow up Falls evaluation completed   Depression screen:Yes - No Depression Most recent depression screenings:    10/08/2024    8:31 AM 10/21/2023    1:32 PM  PHQ 2/9 Scores  PHQ - 2 Score 1 0  PHQ- 9 Score 5     HPI  No problem-specific Assessment & Plan notes found for this encounter.   Past Medical History:  Diagnosis Date   Hypertension    Past Surgical History:  Procedure Laterality Date   ABDOMINAL HYSTERECTOMY     BREAST BIOPSY Right 04/29/2019   Fibroadenoma   BREAST BIOPSY Left    Social History   Socioeconomic History   Marital status: Single    Spouse name: Not on file   Number of children: Not on file   Years of education: Not on file   Highest education level: Not on file  Occupational History   Not on file  Tobacco Use   Smoking status: Never   Smokeless tobacco: Never  Vaping Use   Vaping status: Never Used  Substance and Sexual Activity   Alcohol use: Never   Drug use: Never   Sexual activity: Yes    Partners: Male     Birth control/protection: None  Other Topics Concern   Not on file  Social History Narrative   Not on file   Social Drivers of Health   Tobacco Use: Low Risk (10/08/2024)   Patient History    Smoking Tobacco Use: Never    Smokeless Tobacco Use: Never    Passive Exposure: Not on file  Financial Resource Strain: Not on file  Food Insecurity: Low Risk (07/18/2023)   Received from [facility]   Epic    Within the past 12 months, you worried that your food would run out before you got money to buy more: Never true    Within the past 12 months, the food you bought just didn't last and you didn't have money to get more. : Never true  Transportation Needs: No Transportation Needs (07/18/2023)   Received from [facility]    In the past 12 months, has lack of reliable transportation kept you from medical appointments, meetings, work or from getting things needed for daily living? : No  Physical Activity: Not on file  Stress: Not on file  Social Connections: Not on file  Intimate Partner Violence: Not  on file  Depression (PHQ2-9): Medium Risk (10/08/2024)   Depression (PHQ2-9)    PHQ-2 Score: 5  Alcohol Screen: Not on file  Housing: Low Risk (07/18/2023)   Received from [facility]   Epic    What is your living situation today?: I have a steady place to live    Think about the place you live. Do you have problems with any of the following? Choose all that apply:: None/None on this list  Utilities: Low Risk (07/18/2023)   Received from [facility]   Utilities    In the past 12 months has the electric, gas, oil, or water company threatened to shut off services in your home? : No  Health Literacy: Not on file   Family Status  Relation Name Status   Mother  Alive   Father  Alive   Sister  Alive   Brother  Alive   Neg Hx  (Not Specified)  No partnership data on file   Family History  Problem Relation Age of Onset   Breast cancer Mother 9   Diabetes Mother     Hypertension Mother    Hypertension Father    Diabetes Father    Diabetes Brother    Colon cancer Neg Hx    Colon polyps Neg Hx    Esophageal cancer Neg Hx    Rectal cancer Neg Hx    Stomach cancer Neg Hx    Allergies[1]  Patient Care Team: Darion Juhasz, Roselie Rockford, NP as PCP - General (Internal Medicine)   Medications: Show/hide medication list[2]  Review of Systems  Constitutional:  Negative for activity change, appetite change and unexpected weight change.  Respiratory: Negative.    Cardiovascular: Negative.   Gastrointestinal: Negative.   Endocrine: Negative for cold intolerance and heat intolerance.  Genitourinary: Negative.   Musculoskeletal: Negative.   Skin: Negative.   Neurological: Negative.   Hematological: Negative.   Psychiatric/Behavioral:  Negative for behavioral problems, decreased concentration, dysphoric mood, hallucinations, self-injury, sleep disturbance and suicidal ideas. The patient is not nervous/anxious.         Objective:  BP 118/72 (BP Location: Left Arm, Patient Position: Sitting, Cuff Size: Large)   Pulse 62   Temp 97.6 F (36.4 C) (Oral)   Ht 5' 7 (1.702 m)   Wt 182 lb 6.4 oz (82.7 kg)   SpO2 98%   BMI 28.57 kg/m     Physical Exam Vitals and nursing note reviewed.  Constitutional:      General: She is not in acute distress. HENT:     Right Ear: Tympanic membrane, ear canal and external ear normal.     Left Ear: Tympanic membrane, ear canal and external ear normal.     Nose: Nose normal.  Eyes:     Extraocular Movements: Extraocular movements intact.     Conjunctiva/sclera: Conjunctivae normal.     Pupils: Pupils are equal, round, and reactive to light.  Neck:     Thyroid : No thyroid  mass, thyromegaly or thyroid  tenderness.  Cardiovascular:     Rate and Rhythm: Normal rate and regular rhythm.     Pulses: Normal pulses.     Heart sounds: Normal heart sounds.  Pulmonary:     Effort: Pulmonary effort is normal.     Breath  sounds: Normal breath sounds.  Abdominal:     General: Bowel sounds are normal.     Palpations: Abdomen is soft.  Musculoskeletal:        General: Normal range of motion.  Cervical back: Normal range of motion and neck supple.     Right lower leg: No edema.     Left lower leg: No edema.  Lymphadenopathy:     Cervical: No cervical adenopathy.  Skin:    General: Skin is warm and dry.  Neurological:     Mental Status: She is alert and oriented to person, place, and time.     Cranial Nerves: No cranial nerve deficit.  Psychiatric:        Mood and Affect: Mood normal.        Behavior: Behavior normal.        Thought Content: Thought content normal.      No results found for any visits on 10/08/24.    Assessment & Plan:    Routine Health Maintenance and Physical Exam  Immunization History  Administered Date(s) Administered   Janssen (J&J) SARS-COV-2 Vaccination 12/06/2019   PFIZER Comirnaty(Gray Top)Covid-19 Tri-Sucrose Vaccine 12/15/2019, 01/05/2020, 09/16/2020   PFIZER(Purple Top)SARS-COV-2 Vaccination 12/15/2019, 01/05/2020, 09/21/2020   Tdap 10/08/2023    Health Maintenance  Topic Date Due   Influenza Vaccine  12/22/2024 (Originally 04/24/2024)   COVID-19 Vaccine (8 - 2025-26 season) 12/22/2024 (Originally 05/25/2024)   Hepatitis B Vaccines 19-59 Average Risk (1 of 3 - 19+ 3-dose series) 10/08/2025 (Originally 07/12/1994)   Mammogram  03/25/2026   Cervical Cancer Screening (HPV/Pap Cotest)  10/07/2028   Colonoscopy  10/24/2032   DTaP/Tdap/Td (2 - Td or Tdap) 10/07/2033   HPV VACCINES (No Doses Required) Completed   Hepatitis C Screening  Completed   HIV Screening  Completed   Pneumococcal Vaccine  Aged Out   Meningococcal B Vaccine  Aged Out   Discussed health benefits of physical activity, and encouraged her to engage in regular exercise appropriate for her age and condition.  Problem List Items Addressed This Visit     HTN (hypertension)   Relevant Medications    lisinopril -hydrochlorothiazide  (ZESTORETIC ) 10-12.5 MG tablet   Pure hypercholesterolemia   Relevant Medications   lisinopril -hydrochlorothiazide  (ZESTORETIC ) 10-12.5 MG tablet   Other Relevant Orders   Lipid panel   Other Visit Diagnoses       Preventative health care    -  Primary   Relevant Orders   Hepatic function panel      Return in about 6 months (around 04/07/2025) for HTN, hyperlipidemia (fasting).     Roselie Mood, NP    [1]  Allergies Allergen Reactions   Penicillins     Since childhood  [2]  Outpatient Medications Prior to Visit  Medication Sig   Ascorbic Acid (VITAMIN C) 1000 MG tablet Take 1,000 mg by mouth daily.   cholecalciferol (VITAMIN D3) 25 MCG (1000 UT) tablet Take 1,000 Units by mouth daily.   fluticasone  (FLONASE ) 50 MCG/ACT nasal spray Place 1 spray into both nostrils in the morning and at bedtime.   Multiple Vitamin (MULTIVITAMIN) capsule Take 1 capsule by mouth daily.   naproxen  sodium (ALEVE ) 220 MG tablet Take 220 mg by mouth daily. (Patient taking differently: Take 440 mg by mouth daily.)   [DISCONTINUED] lisinopril -hydrochlorothiazide  (ZESTORETIC ) 10-12.5 MG tablet Take 1 tablet by mouth daily.   [DISCONTINUED] hydrocortisone  (ANUSOL -HC) 25 MG suppository Place 1 suppository (25 mg total) rectally 2 (two) times daily. (Patient not taking: Reported on 10/08/2024)   [DISCONTINUED] omeprazole  (PRILOSEC) 20 MG capsule Take 1 capsule (20 mg total) by mouth daily. (Patient not taking: Reported on 10/08/2024)   No facility-administered medications prior to visit.   "

## 2024-10-14 ENCOUNTER — Ambulatory Visit: Payer: Self-pay | Admitting: Nurse Practitioner

## 2025-04-07 ENCOUNTER — Ambulatory Visit: Admitting: Nurse Practitioner
